# Patient Record
Sex: Female | Born: 1935 | Race: Asian | Hispanic: No | Smoking: Never smoker
Health system: Southern US, Community
[De-identification: ages and names within clinical notes are randomized; demographics above are authoritative.]

## PROBLEM LIST (undated history)

## (undated) DIAGNOSIS — E785 Hyperlipidemia, unspecified: Secondary | ICD-10-CM

## (undated) DIAGNOSIS — E119 Type 2 diabetes mellitus without complications: Secondary | ICD-10-CM

## (undated) DIAGNOSIS — I10 Essential (primary) hypertension: Secondary | ICD-10-CM

---

## 1998-01-04 ENCOUNTER — Encounter: Payer: Self-pay | Admitting: Ophthalmology

## 1998-01-04 ENCOUNTER — Ambulatory Visit (HOSPITAL_COMMUNITY): Admission: RE | Admit: 1998-01-04 | Discharge: 1998-01-05 | Payer: Self-pay | Admitting: Ophthalmology

## 2014-08-05 ENCOUNTER — Ambulatory Visit (INDEPENDENT_AMBULATORY_CARE_PROVIDER_SITE_OTHER): Payer: Medicare Other | Admitting: Emergency Medicine

## 2014-08-05 VITALS — BP 126/60 | HR 84 | Temp 98.8°F | Resp 17 | Ht <= 58 in | Wt 113.2 lb

## 2014-08-05 DIAGNOSIS — Z111 Encounter for screening for respiratory tuberculosis: Secondary | ICD-10-CM

## 2014-08-05 NOTE — Patient Instructions (Signed)

## 2014-08-05 NOTE — Progress Notes (Signed)
   Subjective:  Patient ID: Briana Shaffer, female    DOB: Jun 25, 1919  Age: 81 y.o. MRN: [id number]  CC: PPD Placement   HPI Briana Shaffer presents  with a need for tuberculosis. She's never had tuberculosis and the best of her daughter's knowledge. She has never tested positive. She denies any current complaints or medical issues not taking any medication  History Briana Shaffer has no past medical history on file.   She has no past surgical history on file.   Her  family history is not on file.  She   reports that she has never smoked. She does not have any smokeless tobacco history on file. Her alcohol and drug histories are not on file.  No outpatient prescriptions prior to visit.   No facility-administered medications prior to visit.    History   Social History  . Marital Status: Widowed    Spouse Name: N/A  . Number of Children: N/A  . Years of Education: N/A   Social History Main Topics  . Smoking status: Never Smoker   . Smokeless tobacco: Not on file  . Alcohol Use: Not on file  . Drug Use: Not on file  . Sexual Activity: Not on file   Other Topics Concern  . None   Social History Narrative  . None     Review of Systems  Constitutional: Negative for fever, chills and appetite change.  HENT: Negative for congestion, ear pain, postnasal drip, sinus pressure and sore throat.   Eyes: Negative for pain and redness.  Respiratory: Negative for cough, shortness of breath and wheezing.   Cardiovascular: Negative for leg swelling.  Gastrointestinal: Negative for nausea, vomiting, abdominal pain, diarrhea, constipation and blood in stool.  Endocrine: Negative for polyuria.  Genitourinary: Negative for dysuria, urgency, frequency and flank pain.  Musculoskeletal: Negative for gait problem.  Skin: Negative for rash.  Neurological: Negative for weakness and headaches.  Psychiatric/Behavioral: Negative for confusion and decreased concentration. The patient is not  nervous/anxious.     Objective:  BP 126/60 mmHg  Pulse 84  Temp(Src) 98.8 F (37.1 C) (Oral)  Resp 17  Ht 4' 9.5" (1.461 m)  Wt 113 lb 3.2 oz (51.347 kg)  BMI 24.06 kg/m2  SpO2 97%  Physical Exam  Constitutional: She is oriented to person, place, and time. She appears well-developed and well-nourished.  HENT:  Head: Normocephalic and atraumatic.  Eyes: Conjunctivae are normal. Pupils are equal, round, and reactive to light.  Pulmonary/Chest: Effort normal.  Musculoskeletal: She exhibits no edema.  Neurological: She is alert and oriented to person, place, and time.  Skin: Skin is dry.  Psychiatric: She has a normal mood and affect. Her behavior is normal. Thought content normal.      Assessment & Plan:   Briana Shaffer was seen today for ppd placement.  Diagnoses and all orders for this visit:  Screening-pulmonary TB Orders: -     TB Skin Test   [name] does not currently have medications on file.  No orders of the defined types were placed in this encounter.    Appropriate red flag conditions were discussed with the patient as well as actions that should be taken.  Patient expressed his understanding.  Follow-up: Return in about 2 days (around 08/07/2014).  Carmelina Dane, MD

## 2014-08-05 NOTE — Progress Notes (Signed)
  Tuberculosis Risk Questionnaire  1. Yes Were you born outside the [place] in one of the following parts of the world: [place], [place], [place], [place] or [place]?    2. No Have you traveled outside the [place] and lived for more than one month in one of the following parts of the world: [place], [place], [place], [place] or [place]?    3. No Do you have a compromised immune system such as from any of the following conditions:HIV/AIDS, organ or bone marrow transplantation, diabetes, immunosuppressive medicines (e.g. Prednisone, Remicaide), leukemia, lymphoma, cancer of the head or neck, gastrectomy or jejunal bypass, end-stage renal disease (on dialysis), or silicosis?     4. No Have you ever or do you plan on working in: a residential care center, a health care facility, a jail or prison or homeless shelter?    5. No Have you ever: injected illegal drugs, used crack cocaine, lived in a homeless shelter  or been in jail or prison?     6. No Have you ever been exposed to anyone with infectious tuberculosis?    Tuberculosis Symptom Questionnaire  Do you currently have any of the following symptoms?  1. No Unexplained cough lasting more than 3 weeks?   2. No Unexplained fever lasting more than 3 weeks.   3. No Night Sweats (sweating that leaves the bedclothes and sheets wet)     4. No Shortness of Breath   5. No Chest Pain   6. No Unintentional weight loss    7. No  Unexplained fatigue (very tired for no reason)   

## 2014-08-07 ENCOUNTER — Ambulatory Visit (INDEPENDENT_AMBULATORY_CARE_PROVIDER_SITE_OTHER): Payer: Medicare Other

## 2014-08-07 DIAGNOSIS — Z7689 Persons encountering health services in other specified circumstances: Secondary | ICD-10-CM

## 2014-08-07 DIAGNOSIS — Z111 Encounter for screening for respiratory tuberculosis: Secondary | ICD-10-CM

## 2014-08-07 LAB — TB SKIN TEST
Induration: 0 mm
TB Skin Test: NEGATIVE

## 2014-09-06 ENCOUNTER — Encounter: Payer: Self-pay | Admitting: Podiatry

## 2014-09-06 ENCOUNTER — Ambulatory Visit (INDEPENDENT_AMBULATORY_CARE_PROVIDER_SITE_OTHER): Payer: Medicare Other | Admitting: Podiatry

## 2014-09-06 VITALS — BP 138/82 | HR 76 | Ht <= 58 in | Wt 113.0 lb

## 2014-09-06 DIAGNOSIS — M79606 Pain in leg, unspecified: Secondary | ICD-10-CM

## 2014-09-06 DIAGNOSIS — B351 Tinea unguium: Secondary | ICD-10-CM

## 2014-09-06 NOTE — Patient Instructions (Signed)
Seen for painful nails. All nails debrided. Return as needed.

## 2014-09-06 NOTE — Progress Notes (Signed)
SUBJECTIVE: 81 y.o. year old female presents for deformed nails. Patient is ambulatory using a cane, accompanied by her daughter.  They are concerned about painful deformed nails.   REVIEW OF SYSTEMS: A comprehensive review of systems was negative.  OBJECTIVE: DERMATOLOGIC EXAMINATION: Nails: Thick yellow and dystrophic x 10.  VASCULAR EXAMINATION OF LOWER LIMBS: Pedal pulses: All pedal pulses are palpable with normal pulsation.  No pedal edema or erythema noted. No sign of ischemic changes.  NEUROLOGIC EXAMINATION OF THE LOWER LIMBS: All epicritic and tactile sensations grossly intact.  MUSCULOSKELETAL EXAMINATION: No gross deformities other than irregular bone surface right Fibular, which is an old fracture site that is healed.   ASSESSMENT: Onychomycosis x 10. Pain in lower limbs.  PLAN: All nails debrided. Return as needed.

## 2015-01-09 ENCOUNTER — Emergency Department: Payer: Medicare Other

## 2015-01-09 ENCOUNTER — Encounter: Payer: Self-pay | Admitting: *Deleted

## 2015-01-09 ENCOUNTER — Emergency Department
Admission: EM | Admit: 2015-01-09 | Discharge: 2015-01-09 | Disposition: A | Discharge status: Home / Self Care | Payer: Medicare Other | Attending: Emergency Medicine | Admitting: Emergency Medicine

## 2015-01-09 DIAGNOSIS — Z79899 Other long term (current) drug therapy: Secondary | ICD-10-CM | POA: Insufficient documentation

## 2015-01-09 DIAGNOSIS — Y998 Other external cause status: Secondary | ICD-10-CM | POA: Diagnosis not present

## 2015-01-09 DIAGNOSIS — Y9389 Activity, other specified: Secondary | ICD-10-CM | POA: Insufficient documentation

## 2015-01-09 DIAGNOSIS — F0390 Unspecified dementia without behavioral disturbance: Secondary | ICD-10-CM | POA: Insufficient documentation

## 2015-01-09 DIAGNOSIS — E119 Type 2 diabetes mellitus without complications: Secondary | ICD-10-CM | POA: Diagnosis not present

## 2015-01-09 DIAGNOSIS — W1839XA Other fall on same level, initial encounter: Secondary | ICD-10-CM | POA: Diagnosis not present

## 2015-01-09 DIAGNOSIS — I10 Essential (primary) hypertension: Secondary | ICD-10-CM | POA: Diagnosis not present

## 2015-01-09 DIAGNOSIS — Y92129 Unspecified place in nursing home as the place of occurrence of the external cause: Secondary | ICD-10-CM | POA: Insufficient documentation

## 2015-01-09 DIAGNOSIS — S42411A Displaced simple supracondylar fracture without intercondylar fracture of right humerus, initial encounter for closed fracture: Secondary | ICD-10-CM | POA: Diagnosis not present

## 2015-01-09 DIAGNOSIS — N3001 Acute cystitis with hematuria: Secondary | ICD-10-CM | POA: Diagnosis not present

## 2015-01-09 DIAGNOSIS — S4991XA Unspecified injury of right shoulder and upper arm, initial encounter: Secondary | ICD-10-CM | POA: Diagnosis present

## 2015-01-09 HISTORY — DX: Essential (primary) hypertension: I10

## 2015-01-09 HISTORY — DX: Type 2 diabetes mellitus without complications: E11.9

## 2015-01-09 HISTORY — DX: Hyperlipidemia, unspecified: E78.5

## 2015-01-09 LAB — CBC
HCT: 32.2 % — ABNORMAL LOW (ref 35.0–47.0)
HEMOGLOBIN: 11 g/dL — AB (ref 12.0–16.0)
MCH: 30.6 pg (ref 26.0–34.0)
MCHC: 34.1 g/dL (ref 32.0–36.0)
MCV: 89.8 fL (ref 80.0–100.0)
Platelets: 188 10*3/uL (ref 150–440)
RBC: 3.59 MIL/uL — AB (ref 3.80–5.20)
RDW: 14.2 % (ref 11.5–14.5)
WBC: 7.2 10*3/uL (ref 3.6–11.0)

## 2015-01-09 LAB — URINALYSIS COMPLETE WITH MICROSCOPIC (ARMC ONLY)
Bilirubin Urine: NEGATIVE
Glucose, UA: 150 mg/dL — AB
Ketones, ur: NEGATIVE mg/dL
Nitrite: NEGATIVE
PH: 5 (ref 5.0–8.0)
SPECIFIC GRAVITY, URINE: 1.01 (ref 1.005–1.030)

## 2015-01-09 LAB — COMPREHENSIVE METABOLIC PANEL
ALBUMIN: 3.4 g/dL — AB (ref 3.5–5.0)
ALK PHOS: 90 U/L (ref 38–126)
ALT: 7 U/L — AB (ref 14–54)
AST: 15 U/L (ref 15–41)
Anion gap: 5 (ref 5–15)
BUN: 24 mg/dL — ABNORMAL HIGH (ref 6–20)
CALCIUM: 8.9 mg/dL (ref 8.9–10.3)
CO2: 29 mmol/L (ref 22–32)
CREATININE: 1.19 mg/dL — AB (ref 0.44–1.00)
Chloride: 104 mmol/L (ref 101–111)
GFR calc Af Amer: 44 mL/min — ABNORMAL LOW (ref >60)
GFR calc non Af Amer: 38 mL/min — ABNORMAL LOW (ref >60)
GLUCOSE: 262 mg/dL — AB (ref 65–99)
Potassium: 4 mmol/L (ref 3.5–5.1)
SODIUM: 138 mmol/L (ref 135–145)
Total Bilirubin: 1 mg/dL (ref 0.3–1.2)
Total Protein: 6 g/dL — ABNORMAL LOW (ref 6.5–8.1)

## 2015-01-09 LAB — TROPONIN I: Troponin I: <0.03 ng/mL (ref <0.031)

## 2015-01-09 LAB — CK: Total CK: 51 U/L (ref 38–234)

## 2015-01-09 MED ORDER — DEXTROSE 5 % IV SOLN
1.0000 g | Freq: Once | INTRAVENOUS | Status: AC
  Administered 2015-01-09: 1 g via INTRAVENOUS
  Filled 2015-01-09: qty 10

## 2015-01-09 MED ORDER — CEPHALEXIN 500 MG PO CAPS: 500.0000 mg | ORAL_CAPSULE | Freq: Four times a day (QID) | ORAL | Status: AC

## 2015-01-09 NOTE — ED Notes (Signed)
Spoke with son of patient. Son responded and is in route to ER.

## 2015-01-09 NOTE — ED Notes (Signed)
[facility].

## 2015-01-09 NOTE — Discharge Instructions (Signed)
Urinary Tract Infection Urinary tract infections (UTIs) can develop anywhere along your urinary tract. Your urinary tract is your body's drainage system for removing wastes and extra water. Your urinary tract includes two kidneys, two ureters, a bladder, and a urethra. Your kidneys are a pair of bean-shaped organs. Each kidney is about the size of your fist. They are located below your ribs, one on each side of your spine. CAUSES Infections are caused by microbes, which are microscopic organisms, including fungi, viruses, and bacteria. These organisms are so small that they can only be seen through a microscope. Bacteria are the microbes that most commonly cause UTIs. SYMPTOMS  Symptoms of UTIs may vary by age and gender of the patient and by the location of the infection. Symptoms in young women typically include a frequent and intense urge to urinate and a painful, burning feeling in the bladder or urethra during urination. Older women and men are more likely to be tired, shaky, and weak and have muscle aches and abdominal pain. A fever may mean the infection is in your kidneys. Other symptoms of a kidney infection include pain in your back or sides below the ribs, nausea, and vomiting. DIAGNOSIS To diagnose a UTI, your caregiver will ask you about your symptoms. Your caregiver will also ask you to provide a urine sample. The urine sample will be tested for bacteria and white blood cells. White blood cells are made by your body to help fight infection. TREATMENT  Typically, UTIs can be treated with medication. Because most UTIs are caused by a bacterial infection, they usually can be treated with the use of antibiotics. The choice of antibiotic and length of treatment depend on your symptoms and the type of bacteria causing your infection. HOME CARE INSTRUCTIONS  If you were prescribed antibiotics, take them exactly as your caregiver instructs you. Finish the medication even if you feel better after  you have only taken some of the medication.  Drink enough water and fluids to keep your urine clear or pale yellow.  Avoid caffeine, tea, and carbonated beverages. They tend to irritate your bladder.  Empty your bladder often. Avoid holding urine for long periods of time.  Empty your bladder before and after sexual intercourse.  After a bowel movement, women should cleanse from front to back. Use each tissue only once. SEEK MEDICAL CARE IF:   You have back pain.  You develop a fever.  Your symptoms do not begin to resolve within 3 days. SEEK IMMEDIATE MEDICAL CARE IF:   You have severe back pain or lower abdominal pain.  You develop chills.  You have nausea or vomiting.  You have continued burning or discomfort with urination. MAKE SURE YOU:   Understand these instructions.  Will watch your condition.  Will get help right away if you are not doing well or get worse.   This information is not intended to replace advice given to you by your health care provider. Make sure you discuss any questions you have with your health care provider.   Document Released: 11/21/2004 Document Revised: 11/02/2014 Document Reviewed: 03/22/2011 Elsevier Interactive Patient Education 2016 Elsevier Inc.  Humerus Fracture Treated With Immobilization The humerus is the large bone in your upper arm. You have a broken (fractured) humerus. These fractures are easily diagnosed with X-rays. TREATMENT  Simple fractures which will heal without disability are treated with simple immobilization. Immobilization means you will wear a cast, splint, or sling. You have a fracture which will do well  with immobilization. The fracture will heal well simply by being held in a good position until it is stable enough to begin range of motion exercises. Do not take part in activities which would further injure your arm.  HOME CARE INSTRUCTIONS   Put ice on the injured area.  Put ice in a plastic bag.  Place a  towel between your skin and the bag.  Leave the ice on for 15-20 minutes, 03-04 times a day.  If you have a cast:  Do not scratch the skin under the cast using sharp or pointed objects.  Check the skin around the cast every day. You may put lotion on any red or sore areas.  Keep your cast dry and clean.  If you have a splint:  Wear the splint as directed.  Keep your splint dry and clean.  You may loosen the elastic around the splint if your fingers become numb, tingle, or turn cold or blue.  If you have a sling:  Wear the sling as directed.  Do not put pressure on any part of your cast or splint until it is fully hardened.  Your cast or splint can be protected during bathing with a plastic bag. Do not lower the cast or splint into water.  Only take over-the-counter or prescription medicines for pain, discomfort, or fever as directed by your caregiver.  Do range of motion exercises as instructed by your caregiver.  Follow up as directed by your caregiver. This is very important in order to avoid permanent injury or disability and chronic pain. SEEK IMMEDIATE MEDICAL CARE IF:   Your skin or nails in the injured arm turn blue or gray.  Your arm feels cold or numb.  You develop severe pain in the injured arm.  You are having problems with the medicines you were given. MAKE SURE YOU:   Understand these instructions.  Will watch your condition.  Will get help right away if you are not doing well or get worse.   This information is not intended to replace advice given to you by your health care provider. Make sure you discuss any questions you have with your health care provider.   Document Released: 05/20/2000 Document Revised: 03/04/2014 Document Reviewed: 07/06/2014 Elsevier Interactive Patient Education [employer]2016 Elsevier Inc.

## 2015-01-09 NOTE — ED Notes (Signed)
Language [employer] Interpreter services attempted. Pt unable to respond to verbal and painful stimuli commands. MD verbal discontinuation of interpreter services.

## 2015-01-09 NOTE — ED Notes (Signed)
Patient transported to X-ray 

## 2015-01-09 NOTE — ED Notes (Signed)
Pt from [facility], was found on floor unknown down time, c/o rt elbow and shoulder pain.  Rt elbow swollen.  Pt speaks no english. Grimace upon palpation to rt shoulder and elbow.

## 2015-01-09 NOTE — ED Notes (Signed)
Family member has failed to arrive to ER, attempted to call a 2nd time. Son answered and responded for an ETA arrival time being 9 AM. Will make MD aware.

## 2015-01-09 NOTE — ED Provider Notes (Signed)
[facility] Emergency Department Provider Note  ____________________________________________  Time seen: Approximately 233 AM  I have reviewed the triage vital signs and the nursing notes.   HISTORY  Chief Complaint Shoulder Injury and Fall  History limited by patient language barrier and patient dementia  HPI Briana Shaffer is a 81 y.o. female who comes in from her nursing home with an unwitnessed fall. We attempted to use the language line to obtain a history but the patient would not verbally answer questions. She nodded and shook her head inappropriately to questions. I am unsure what occurred to cause the patient to fall. Per EMS the patient had some hypotension to 74/50 but when she arrived her BP was 153/97.   Past Medical History  Diagnosis Date  . Diabetes mellitus without complication (HCC)   . Hypertension   . Hyperlipemia        Dementia  Patient Active Problem List   Diagnosis Date Noted  . Onychomycosis 09/06/2014  . Pain in lower limb 09/06/2014    History reviewed. No pertinent past surgical history.  Current Outpatient Rx  Name  Route  Sig  Dispense  Refill  . acetaminophen (TYLENOL) 325 MG tablet   Oral   Take 650 mg by mouth every 6 (six) hours as needed.         . lamoTRIgine (LAMICTAL) 25 MG tablet   Oral   Take 25 mg by mouth 2 (two) times daily.         . QUEtiapine (SEROQUEL) 50 MG tablet   Oral   Take 50 mg by mouth 3 (three) times daily.         . traMADol (ULTRAM) 50 MG tablet   Oral   Take 50 mg by mouth every 12 (twelve) hours as needed.         . cephALEXin (KEFLEX) 500 MG capsule   Oral   Take 1 capsule (500 mg total) by mouth 4 (four) times daily.   40 capsule   0     Allergies Review of patient's allergies indicates no known allergies.  History reviewed. No pertinent family history.  Social History Social History  Substance Use Topics  . Smoking status: Never Smoker   . Smokeless  tobacco: Never Used  . Alcohol Use: No    Review of Systems Unable to assess due to patient dementia and language barrier  ____________________________________________   PHYSICAL EXAM:  VITAL SIGNS: ED Triage Vitals  Enc Vitals Group     BP 01/09/15 0209 153/97 mmHg     Pulse Rate 01/09/15 0209 101     Resp 01/09/15 0505 20     Temp 01/09/15 0209 98.2 F (36.8 C)     Temp Source 01/09/15 0209 Oral     SpO2 01/09/15 0209 98 %     Weight 01/09/15 0209 125 lb (56.7 kg)     Height 01/09/15 0209 4\' 10"  (1.473 m)     Head Cir --      Peak Flow --      Pain Score --      Pain Loc --      Pain Edu? --      Excl. in GC? --     Constitutional: patient with eyes closed, opens eyes to stimuli but not making verbal response Eyes: Conjunctivae are normal. PERRL. EOMI. Head: Atraumatic. Nose: No congestion/rhinnorhea. Mouth/Throat: Mucous membranes are moist.  Oropharynx non-erythematous. Cardiovascular: Normal rate, regular rhythm. Grossly normal heart sounds.  Good peripheral circulation. Respiratory: Normal respiratory effort.  No retractions. Lungs CTAB. Gastrointestinal: Soft and nontender. No distention. Positive bowel sounds Musculoskeletal: grimaces with manipulation of right arm  Neurologic:  Not responding verbally Skin:  Skin is warm, dry and intact. No rash noted. Psychiatric: Mood and affect are normal.   ____________________________________________   LABS (all labs ordered are listed, but only abnormal results are displayed)  Labs Reviewed  CBC - Abnormal; Notable for the following:    RBC 3.59 (*)    Hemoglobin 11.0 (*)    HCT 32.2 (*)    All other components within normal limits  COMPREHENSIVE METABOLIC PANEL - Abnormal; Notable for the following:    Glucose, Bld 262 (*)    BUN 24 (*)    Creatinine, Ser 1.19 (*)    Total Protein 6.0 (*)    Albumin 3.4 (*)    ALT 7 (*)    GFR calc non Af Amer 38 (*)    GFR calc Af Amer 44 (*)    All other components  within normal limits  URINALYSIS COMPLETEWITH MICROSCOPIC ([facility] ONLY) - Abnormal; Notable for the following:    Color, Urine YELLOW (*)    APPearance CLOUDY (*)    Glucose, UA 150 (*)    Hgb urine dipstick 2+ (*)    Protein, ur >500 (*)    Leukocytes, UA TRACE (*)    Bacteria, UA MANY (*)    Squamous Epithelial / LPF 0-5 (*)    All other components within normal limits  URINE CULTURE  CULTURE, BLOOD (ROUTINE X 2)  CULTURE, BLOOD (ROUTINE X 2)  TROPONIN I  CK   ____________________________________________  EKG  ED ECG REPORT I, Rebecka Apley, the attending physician, personally viewed and interpreted this ECG.   Date: 01/09/2015  EKG Time: 212  Rate: 108  Rhythm: sinus tachycardia  Axis: normal  Intervals:none  ST&T Change: none  ____________________________________________  RADIOLOGY  CT head and cervical spine: No acute intracranial process, moderate to severe chronic small vessel ischemic disease, Straightened cervical lordosis without acute fracture, minimal grade 1 C4-5 and C6-7 anterolithesis on degenerative basis.   DG shoulder: NO evidence of right shoulder fracture  DG Elbow: displaced transverse supracondylar fracture with mild angulation  ____________________________________________   PROCEDURES  Procedure(s) performed: None  Critical Care performed: No  ____________________________________________   INITIAL IMPRESSION / ASSESSMENT AND PLAN / ED COURSE  Pertinent labs & imaging results that were available during my care of the patient were reviewed by me and considered in my medical decision making (see chart for details).  Is a 81 year old female who comes in today with a fall at her nursing home and a supracondylar fracture. I did contact Dr. Rosita Kea who suggested splinting the patient and having her follow-up in one week. Given the patient's age and dementia as well as the fact that no family is here he said it's going to be a difficult  repair so he will evaluate her in 1 week. The patient does also have a urinary tract infection. I did order some ceftriaxone to treat her UTI. I will assess if the patient is able to ambulate and I will also assess the CK. I will reassess the patient once I received those results.  The patient is wheel chair bound. She will be discharged home with antibiotics and follow up with PCP ____________________________________________   FINAL CLINICAL IMPRESSION(S) / ED DIAGNOSES  Final diagnoses:  Supracondylar fracture of humerus, right, closed, initial  encounter  Acute cystitis with hematuria      Rebecka Apley, MD 01/09/15 [phone]

## 2015-01-09 NOTE — ED Notes (Signed)
[facility].

## 2015-01-11 LAB — URINE CULTURE: Culture: >=100000

## 2015-01-14 LAB — CULTURE, BLOOD (ROUTINE X 2)
CULTURE: NO GROWTH
Culture: NO GROWTH

## 2015-01-22 ENCOUNTER — Emergency Department
Admission: EM | Admit: 2015-01-22 | Discharge: 2015-01-22 | Disposition: A | Discharge status: Home / Self Care | Payer: Medicare Other | Attending: Emergency Medicine | Admitting: Emergency Medicine

## 2015-01-22 ENCOUNTER — Encounter: Payer: Self-pay | Admitting: Emergency Medicine

## 2015-01-22 ENCOUNTER — Emergency Department: Payer: Medicare Other

## 2015-01-22 DIAGNOSIS — F0390 Unspecified dementia without behavioral disturbance: Secondary | ICD-10-CM | POA: Diagnosis not present

## 2015-01-22 DIAGNOSIS — T148XXA Other injury of unspecified body region, initial encounter: Secondary | ICD-10-CM

## 2015-01-22 DIAGNOSIS — Y9289 Other specified places as the place of occurrence of the external cause: Secondary | ICD-10-CM | POA: Diagnosis not present

## 2015-01-22 DIAGNOSIS — Z79899 Other long term (current) drug therapy: Secondary | ICD-10-CM | POA: Insufficient documentation

## 2015-01-22 DIAGNOSIS — Y9389 Activity, other specified: Secondary | ICD-10-CM | POA: Insufficient documentation

## 2015-01-22 DIAGNOSIS — S0990XA Unspecified injury of head, initial encounter: Secondary | ICD-10-CM | POA: Diagnosis present

## 2015-01-22 DIAGNOSIS — Y998 Other external cause status: Secondary | ICD-10-CM | POA: Insufficient documentation

## 2015-01-22 DIAGNOSIS — W1839XA Other fall on same level, initial encounter: Secondary | ICD-10-CM | POA: Diagnosis not present

## 2015-01-22 DIAGNOSIS — I10 Essential (primary) hypertension: Secondary | ICD-10-CM | POA: Insufficient documentation

## 2015-01-22 DIAGNOSIS — E119 Type 2 diabetes mellitus without complications: Secondary | ICD-10-CM | POA: Diagnosis not present

## 2015-01-22 DIAGNOSIS — W19XXXA Unspecified fall, initial encounter: Secondary | ICD-10-CM

## 2015-01-22 DIAGNOSIS — S0083XA Contusion of other part of head, initial encounter: Secondary | ICD-10-CM | POA: Diagnosis not present

## 2015-01-22 LAB — CBC WITH DIFFERENTIAL/PLATELET
BASOS ABS: 0 10*3/uL (ref 0–0.1)
BASOS PCT: 1 %
EOS ABS: 0.1 10*3/uL (ref 0–0.7)
EOS PCT: 2 %
HCT: 31.8 % — ABNORMAL LOW (ref 35.0–47.0)
Hemoglobin: 10.4 g/dL — ABNORMAL LOW (ref 12.0–16.0)
Lymphocytes Relative: 35 %
Lymphs Abs: 2.2 10*3/uL (ref 1.0–3.6)
MCH: 29.4 pg (ref 26.0–34.0)
MCHC: 32.7 g/dL (ref 32.0–36.0)
MCV: 89.9 fL (ref 80.0–100.0)
Monocytes Absolute: 0.6 10*3/uL (ref 0.2–0.9)
Monocytes Relative: 9 %
NEUTROS PCT: 53 %
Neutro Abs: 3.4 10*3/uL (ref 1.4–6.5)
PLATELETS: 259 10*3/uL (ref 150–440)
RBC: 3.53 MIL/uL — AB (ref 3.80–5.20)
RDW: 14.1 % (ref 11.5–14.5)
WBC: 6.3 10*3/uL (ref 3.6–11.0)

## 2015-01-22 LAB — BASIC METABOLIC PANEL
Anion gap: 6 (ref 5–15)
BUN: 23 mg/dL — AB (ref 6–20)
CALCIUM: 9 mg/dL (ref 8.9–10.3)
CHLORIDE: 100 mmol/L — AB (ref 101–111)
CO2: 32 mmol/L (ref 22–32)
CREATININE: 1.24 mg/dL — AB (ref 0.44–1.00)
GFR calc Af Amer: 42 mL/min — ABNORMAL LOW (ref >60)
GFR calc non Af Amer: 36 mL/min — ABNORMAL LOW (ref >60)
GLUCOSE: 207 mg/dL — AB (ref 65–99)
Potassium: 4.1 mmol/L (ref 3.5–5.1)
SODIUM: 138 mmol/L (ref 135–145)

## 2015-01-22 LAB — URINALYSIS COMPLETE WITH MICROSCOPIC (ARMC ONLY)
BACTERIA UA: NONE SEEN
Bilirubin Urine: NEGATIVE
Glucose, UA: 50 mg/dL — AB
Ketones, ur: NEGATIVE mg/dL
Leukocytes, UA: NEGATIVE
Nitrite: NEGATIVE
PH: 6 (ref 5.0–8.0)
Specific Gravity, Urine: 1.012 (ref 1.005–1.030)

## 2015-01-22 LAB — TROPONIN I: Troponin I: <0.03 ng/mL (ref <0.031)

## 2015-01-22 MED ORDER — ONDANSETRON HCL 4 MG/2ML IJ SOLN
4.0000 mg | Freq: Once | INTRAMUSCULAR | Status: AC
  Administered 2015-01-22: 4 mg via INTRAVENOUS

## 2015-01-22 MED ORDER — IOHEXOL 300 MG/ML  SOLN
80.0000 mL | Freq: Once | INTRAMUSCULAR | Status: AC | PRN
  Administered 2015-01-22: 75 mL via INTRAVENOUS

## 2015-01-22 MED ORDER — MORPHINE SULFATE (PF) 2 MG/ML IV SOLN
2.0000 mg | Freq: Once | INTRAVENOUS | Status: AC
  Administered 2015-01-22: 2 mg via INTRAVENOUS
  Filled 2015-01-22: qty 1

## 2015-01-22 MED ORDER — ONDANSETRON HCL 4 MG/2ML IJ SOLN
INTRAMUSCULAR | Status: AC
  Administered 2015-01-22: 4 mg via INTRAVENOUS
  Filled 2015-01-22: qty 2

## 2015-01-22 NOTE — ED Notes (Signed)
Pt from [facility] c/o unwitnessed fall

## 2015-01-22 NOTE — Discharge Instructions (Signed)
Please seek medical attention for any high fevers, chest pain, shortness of breath, change in behavior, persistent vomiting, bloody stool or any other new or concerning symptoms. ° °Fall Prevention in the Home  °Falls can cause injuries and can affect people from all age groups. There are many simple things that you can do to make your home safe and to help prevent falls. °WHAT CAN I DO ON THE OUTSIDE OF MY HOME? °· Regularly repair the edges of walkways and driveways and fix any cracks. °· Remove high doorway thresholds. °· Trim any shrubbery on the main path into your home. °· Use bright outdoor lighting. °· Clear walkways of debris and clutter, including tools and rocks. °· Regularly check that handrails are securely fastened and in good repair. Both sides of any steps should have handrails. °· Install guardrails along the edges of any raised decks or porches. °· Have leaves, snow, and ice cleared regularly. °· Use sand or salt on walkways during winter months. °· In the garage, clean up any spills right away, including grease or oil spills. °WHAT CAN I DO IN THE BATHROOM? °· Use night lights. °· Install grab bars by the toilet and in the tub and shower. Do not use towel bars as grab bars. °· Use non-skid mats or decals on the floor of the tub or shower. °· If you need to sit down while you are in the shower, use a plastic, non-slip stool.. °· Keep the floor dry. Immediately clean up any water that spills on the floor. °· Remove soap buildup in the tub or shower on a regular basis. °· Attach bath mats securely with double-sided non-slip rug tape. °· Remove throw rugs and other tripping hazards from the floor. °WHAT CAN I DO IN THE BEDROOM? °· Use night lights. °· Make sure that a bedside light is easy to reach. °· Do not use oversized bedding that drapes onto the floor. °· Have a firm chair that has side arms to use for getting dressed. °· Remove throw rugs and other tripping hazards from the floor. °WHAT CAN I  DO IN THE KITCHEN?  °· Clean up any spills right away. °· Avoid walking on wet floors. °· Place frequently used items in easy-to-reach places. °· If you need to reach for something above you, use a sturdy step stool that has a grab bar. °· Keep electrical cables out of the way. °· Do not use floor polish or wax that makes floors slippery. If you have to use wax, make sure that it is non-skid floor wax. °· Remove throw rugs and other tripping hazards from the floor. °WHAT CAN I DO IN THE STAIRWAYS? °· Do not leave any items on the stairs. °· Make sure that there are handrails on both sides of the stairs. Fix handrails that are broken or loose. Make sure that handrails are as long as the stairways. °· Check any carpeting to make sure that it is firmly attached to the stairs. Fix any carpet that is loose or worn. °· Avoid having throw rugs at the top or bottom of stairways, or secure the rugs with carpet tape to prevent them from moving. °· Make sure that you have a light switch at the top of the stairs and the bottom of the stairs. If you do not have them, have them installed. °WHAT ARE SOME OTHER FALL PREVENTION TIPS? °· Wear closed-toe shoes that fit well and support your feet. Wear shoes that have rubber soles or low   heels. °· When you use a stepladder, make sure that it is completely opened and that the sides are firmly locked. Have someone hold the ladder while you are using it. Do not climb a closed stepladder. °· Add color or contrast paint or tape to grab bars and handrails in your home. Place contrasting color strips on the first and last steps. °· Use mobility aids as needed, such as canes, walkers, scooters, and crutches. °· Turn on lights if it is dark. Replace any light bulbs that burn out. °· Set up furniture so that there are clear paths. Keep the furniture in the same spot. °· Fix any uneven floor surfaces. °· Choose a carpet design that does not hide the edge of steps of a stairway. °· Be aware of any  and all pets. °· Review your medicines with your healthcare provider. Some medicines can cause dizziness or changes in blood pressure, which increase your risk of falling. °Talk with your health care provider about other ways that you can decrease your risk of falls. This may include working with a physical therapist or trainer to improve your strength, balance, and endurance. °  °This information is not intended to replace advice given to you by your health care provider. Make sure you discuss any questions you have with your health care provider. °  °Document Released: 02/01/2002 Document Revised: 06/28/2014 Document Reviewed: 03/18/2014 °Elsevier Interactive Patient Education ©2016 Elsevier Inc. ° °

## 2015-01-22 NOTE — ED Notes (Signed)
Pt to ct 

## 2015-01-22 NOTE — ED Notes (Signed)
Patient transported to CT 

## 2015-01-22 NOTE — ED Provider Notes (Signed)
CT was done because of the hematuria. This could possibly be due to the need to catheterize the patient get a urine specimen. CT showing Harrold Donathobert Holmes there were no white blood cells in the urine at all. Cannot find any other acute pathology I will discharge the patient as we discussed with Dr. Sherlyn LickGoodman  Jolene Guyett F Sunnie Odden, MD 01/22/15 [phone]

## 2015-01-22 NOTE — ED Notes (Signed)
Pt unable to comply with instructions, even using interpreter. Pt moaning and will not open her eyes. She was able to respond affirmatively when asked if she was in pain, as well as whether or not her pain was in her head.

## 2015-01-22 NOTE — ED Provider Notes (Signed)
[facility] Emergency Department Provider Note    ____________________________________________  Time seen: 1345  I have reviewed the triage vital signs and the nursing notes.   HISTORY  Chief Complaint Fall   History limited by: dementia, language barrier   HPI Briana Shaffer is a 81 y.o. female who speaks screen however is essentially nonverbal who presents to the emergency department from living facility today after a fall.the patient is unable to give any history. The patient does suffer a hematoma to her head. Patient was seen recently in the ED and diagnosed with a supracondylar fracture of the right arm. She did have placed this splint. The patient has had some swelling in the hand although has been evaluated for that.   Past Medical History  Diagnosis Date  . Diabetes mellitus without complication (HCC)   . Hypertension   . Hyperlipemia     Patient Active Problem List   Diagnosis Date Noted  . Onychomycosis 09/06/2014  . Pain in lower limb 09/06/2014    History reviewed. No pertinent past surgical history.  Current Outpatient Rx  Name  Route  Sig  Dispense  Refill  . acetaminophen (TYLENOL) 325 MG tablet   Oral   Take 650 mg by mouth every 6 (six) hours as needed.         . lamoTRIgine (LAMICTAL) 25 MG tablet   Oral   Take 25 mg by mouth 2 (two) times daily.         . QUEtiapine (SEROQUEL) 50 MG tablet   Oral   Take 50 mg by mouth 3 (three) times daily.         . traMADol (ULTRAM) 50 MG tablet   Oral   Take 50 mg by mouth every 12 (twelve) hours as needed.           Allergies Review of patient's allergies indicates no known allergies.  History reviewed. No pertinent family history.  Social History Social History  Substance Use Topics  . Smoking status: Never Smoker   . Smokeless tobacco: Never Used  . Alcohol Use: No    Review of Systems Unable to obtain secondary to language barrier and  dementia  ____________________________________________   PHYSICAL EXAM:  VITAL SIGNS: ED Triage Vitals  Enc Vitals Group     BP 01/22/15 1339 165/96 mmHg     Pulse Rate 01/22/15 1339 87     Resp --      Temp 01/22/15 1339 97.6 F (36.4 C)     Temp Source 01/22/15 1339 Oral     SpO2 01/22/15 1339 100 %     Weight 01/22/15 1339 121 lb (54.885 kg)     Height 01/22/15 1339 5' (1.524 m)   Constitutional: Alert and oriented. Well appearing and in no distress. Eyes: Conjunctivae are normal. PERRL. Normal extraocular movements. ENT   Head: Normocephalic, small hematoma to posterior scalp   Nose: No congestion/rhinnorhea.   Mouth/Throat: Mucous membranes are moist.   Neck: No stridor. Hematological/Lymphatic/Immunilogical: No cervical lymphadenopathy. Cardiovascular: Normal rate, regular rhythm.  No murmurs, rubs, or gallops. Respiratory: Normal respiratory effort without tachypnea nor retractions. Breath sounds are clear and equal bilaterally. No wheezes/rales/rhonchi. Gastrointestinal: Soft and nontender. No distention.  Genitourinary: Deferred Musculoskeletal: Right arm in splint, some edema to hand, however soft, non tender, RP 2+. No proximal swelling in the arm. Neurologic:  Awake. Demented. Non verbal. Appears to move all extremities.  Skin:  Skin is warm, dry and intact. No rash noted.  ____________________________________________    LABS (pertinent positives/negatives)  WBC 6.3 Hgb 10.4 Plt 259 Na 128 K 4.1 Trop <0.03 ____________________________________________   EKG  I, Phineas Semen, attending physician, personally viewed and interpreted this EKG  EKG Time: 1402 Rate: 89 Rhythm: normal sinus rhythm Axis: normal Intervals: qtc 448 QRS: narrow, q waves V1, V2 ST changes: no st elevation Impression: abnormal ekg ____________________________________________    RADIOLology  CT head/cervical spine  IMPRESSION: No acute intracranial  findings.  Chronic ischemic microvascular disease with a few old bilateral basal ganglia lacunar infarcts and evidence of age related atrophy. Stable air-fluid level over the sphenoid sinus.  No acute cervical spine injury.  Mild spondylosis of the cervical spine with disc disease at the C5-6 level. Moderate bilateral neural foraminal narrowing at multiple levels.   ____________________________________________   PROCEDURES  Procedure(s) performed: None  Critical Care performed: No  ____________________________________________   INITIAL IMPRESSION / ASSESSMENT AND PLAN / ED COURSE  Pertinent labs & imaging results that were available during my care of the patient were reviewed by me and considered in my medical decision making (see chart for details).  Patient presented from living facility after an unwitnessed fall. Patient unfortunately unable to give any history. Patient does have a history of falls. CT head and cervical spine without any concerning findings. Blood work without any concerning findings. Awaiting a UA at time of sign out. ____________________________________________   FINAL CLINICAL IMPRESSION(S) / ED DIAGNOSES  Briana Boxer, MD 01/23/15 1450

## 2015-01-28 ENCOUNTER — Emergency Department: Payer: Medicare Other

## 2015-01-28 ENCOUNTER — Encounter: Payer: Self-pay | Admitting: Emergency Medicine

## 2015-01-28 ENCOUNTER — Emergency Department
Admission: EM | Admit: 2015-01-28 | Discharge: 2015-01-29 | Disposition: A | Discharge status: Home / Self Care | Payer: Medicare Other | Attending: Emergency Medicine | Admitting: Emergency Medicine

## 2015-01-28 DIAGNOSIS — Z79899 Other long term (current) drug therapy: Secondary | ICD-10-CM | POA: Diagnosis not present

## 2015-01-28 DIAGNOSIS — E119 Type 2 diabetes mellitus without complications: Secondary | ICD-10-CM | POA: Diagnosis not present

## 2015-01-28 DIAGNOSIS — N390 Urinary tract infection, site not specified: Secondary | ICD-10-CM | POA: Diagnosis not present

## 2015-01-28 DIAGNOSIS — S0083XA Contusion of other part of head, initial encounter: Secondary | ICD-10-CM | POA: Diagnosis not present

## 2015-01-28 DIAGNOSIS — Y998 Other external cause status: Secondary | ICD-10-CM | POA: Diagnosis not present

## 2015-01-28 DIAGNOSIS — IMO0002 Reserved for concepts with insufficient information to code with codable children: Secondary | ICD-10-CM

## 2015-01-28 DIAGNOSIS — R4182 Altered mental status, unspecified: Secondary | ICD-10-CM | POA: Insufficient documentation

## 2015-01-28 DIAGNOSIS — Y9389 Activity, other specified: Secondary | ICD-10-CM | POA: Diagnosis not present

## 2015-01-28 DIAGNOSIS — Y9289 Other specified places as the place of occurrence of the external cause: Secondary | ICD-10-CM | POA: Insufficient documentation

## 2015-01-28 DIAGNOSIS — S0081XA Abrasion of other part of head, initial encounter: Secondary | ICD-10-CM | POA: Diagnosis not present

## 2015-01-28 DIAGNOSIS — S0990XA Unspecified injury of head, initial encounter: Secondary | ICD-10-CM | POA: Diagnosis present

## 2015-01-28 DIAGNOSIS — I10 Essential (primary) hypertension: Secondary | ICD-10-CM | POA: Diagnosis not present

## 2015-01-28 DIAGNOSIS — W19XXXA Unspecified fall, initial encounter: Secondary | ICD-10-CM

## 2015-01-28 LAB — CBC WITH DIFFERENTIAL/PLATELET
BASOS PCT: 0 %
Basophils Absolute: 0 10*3/uL (ref 0–0.1)
Eosinophils Absolute: 0.1 10*3/uL (ref 0–0.7)
Eosinophils Relative: 1 %
HEMATOCRIT: 29.4 % — AB (ref 35.0–47.0)
HEMOGLOBIN: 9.6 g/dL — AB (ref 12.0–16.0)
LYMPHS ABS: 1.7 10*3/uL (ref 1.0–3.6)
Lymphocytes Relative: 25 %
MCH: 29.5 pg (ref 26.0–34.0)
MCHC: 32.8 g/dL (ref 32.0–36.0)
MCV: 89.9 fL (ref 80.0–100.0)
MONOS PCT: 8 %
Monocytes Absolute: 0.6 10*3/uL (ref 0.2–0.9)
NEUTROS ABS: 4.5 10*3/uL (ref 1.4–6.5)
NEUTROS PCT: 66 %
Platelets: 245 10*3/uL (ref 150–440)
RBC: 3.27 MIL/uL — ABNORMAL LOW (ref 3.80–5.20)
RDW: 14 % (ref 11.5–14.5)
WBC: 6.9 10*3/uL (ref 3.6–11.0)

## 2015-01-28 LAB — BASIC METABOLIC PANEL
Anion gap: 7 (ref 5–15)
BUN: 27 mg/dL — ABNORMAL HIGH (ref 6–20)
CHLORIDE: 100 mmol/L — AB (ref 101–111)
CO2: 29 mmol/L (ref 22–32)
CREATININE: 1.23 mg/dL — AB (ref 0.44–1.00)
Calcium: 8.7 mg/dL — ABNORMAL LOW (ref 8.9–10.3)
GFR calc non Af Amer: 37 mL/min — ABNORMAL LOW (ref >60)
GFR, EST AFRICAN AMERICAN: 42 mL/min — AB (ref >60)
Glucose, Bld: 236 mg/dL — ABNORMAL HIGH (ref 65–99)
POTASSIUM: 3.6 mmol/L (ref 3.5–5.1)
Sodium: 136 mmol/L (ref 135–145)

## 2015-01-28 LAB — TYPE AND SCREEN
ABO/RH(D): A POS
ANTIBODY SCREEN: NEGATIVE

## 2015-01-28 LAB — PROTIME-INR
INR: 1.06
Prothrombin Time: 14 seconds (ref 11.4–15.0)

## 2015-01-28 LAB — ABO/RH: ABO/RH(D): A POS

## 2015-01-28 LAB — APTT: APTT: 31 s (ref 24–36)

## 2015-01-28 LAB — TROPONIN I

## 2015-01-28 NOTE — ED Provider Notes (Signed)
[facility] Emergency Department Provider Note  ____________________________________________  Time seen: Seen upon arrival to the emergency department  I have reviewed the triage vital signs and the nursing notes.   HISTORY  Chief Complaint Fall and Altered Mental Status  Patient is mostly [place] speaking and also has a history of dementia and per the EMS usually responds to her name and says her name only.  HPI Briana Shaffer is a 81 y.o. female with a history of diabetes and dementia who is presenting tonight after a fall with decreased mental status. Per EMS she was seen earlier this afternoon falling out of her wheelchair forward onto her face at about 2 PM. She developed hematoma and also decreased mentation since.The rest of the history is confounded by the patient's altered mental status.   Past Medical History  Diagnosis Date  . Diabetes mellitus without complication (HCC)   . Hypertension   . Hyperlipemia     Patient Active Problem List   Diagnosis Date Noted  . Onychomycosis 09/06/2014  . Pain in lower limb 09/06/2014    History reviewed. No pertinent past surgical history.  Current Outpatient Rx  Name  Route  Sig  Dispense  Refill  . acetaminophen (TYLENOL) 325 MG tablet   Oral   Take 650 mg by mouth every 6 (six) hours as needed.         . lamoTRIgine (LAMICTAL) 25 MG tablet   Oral   Take 25 mg by mouth 2 (two) times daily.         . QUEtiapine (SEROQUEL) 50 MG tablet   Oral   Take 50 mg by mouth 3 (three) times daily.         . traMADol (ULTRAM) 50 MG tablet   Oral   Take 50 mg by mouth every 12 (twelve) hours as needed.           Allergies Review of patient's allergies indicates no known allergies.  History reviewed. No pertinent family history.  Social History Social History  Substance Use Topics  . Smoking status: Never Smoker   . Smokeless tobacco: Never Used  . Alcohol Use: No    Review of  Systems Caveat secondary to the patient's altered mental status and also language barrier.  ____________________________________________   PHYSICAL EXAM:  VITAL SIGNS: ED Triage Vitals  Enc Vitals Group     BP 01/28/15 2113 137/73 mmHg     Pulse Rate 01/28/15 2113 91     Resp 01/28/15 2113 14     Temp 01/28/15 2113 98.4 F (36.9 C)     Temp Source 01/28/15 2113 Oral     SpO2 01/28/15 2113 95 %     Weight 01/28/15 2113 120 lb (54.432 kg)     Height 01/28/15 2113 5' (1.524 m)     Head Cir --      Peak Flow --      Pain Score --      Pain Loc --      Pain Edu? --      Excl. in GC? --     Constitutional: Patient alert and oriented to her name only. Eyes are closed and she is not following simple commands. Unclear if this is related to mentation or is due to the language barrier. Eyes: Conjunctivae are normal. Right eye oval-shaped but history of a cataract in that eye. The left eye is 2-3 mm and minimally reactive. EOMI. Head: Frontal hematoma with abrasion that is  1-2 cm. The abrasion is superficial.  Nose: No congestion/rhinnorhea. Mouth/Throat: Mucous membranes are moist.   Neck: No stridor.   Cardiovascular: Normal rate, regular rhythm. Grossly normal heart sounds.  Good peripheral circulation. Respiratory: Normal respiratory effort.  No retractions. Lungs CTAB. Gastrointestinal: Soft and nontender. No distention. No abdominal bruits. No CVA tenderness. Musculoskeletal: No lower extremity tenderness nor edema.  No joint effusions. Right upper extremity is in a sling secondary to previous fracture. Neurologic:  Moving the bilateral lower extremities equally. Not following commands to move the left upper extremity or right upper extremity. Does have good tone to the left upper extremity. Right upper extremity is limited to the exam because of the sling. She does have bilateral and intact radial pulses. Skin:  Skin is warm, dry and intact. No rash noted. Psychiatric: Mood and  affect are normal. Speech and behavior are normal.  ____________________________________________   LABS (all labs ordered are listed, but only abnormal results are displayed)  Labs Reviewed  CBC WITH DIFFERENTIAL/PLATELET - Abnormal; Notable for the following:    RBC 3.27 (*)    Hemoglobin 9.6 (*)    HCT 29.4 (*)    All other components within normal limits  BASIC METABOLIC PANEL - Abnormal; Notable for the following:    Chloride 100 (*)    Glucose, Bld 236 (*)    BUN 27 (*)    Creatinine, Ser 1.23 (*)    Calcium 8.7 (*)    GFR calc non Af Amer 37 (*)    GFR calc Af Amer 42 (*)    All other components within normal limits  PROTIME-INR  APTT  TROPONIN I  URINALYSIS COMPLETEWITH MICROSCOPIC ([facility] ONLY)  TYPE AND SCREEN  ABO/RH   ____________________________________________  EKG  ED ECG REPORT I, Arelia LongestSchaevitz,  Ranferi Clingan M, the attending physician, personally viewed and interpreted this ECG.   Date: 01/28/2015  EKG Time: 2121  Rate: 95  Rhythm: normal EKG, normal sinus rhythm  Axis: Normal axis  Intervals:none  ST&T Change: No ST segment elevation or depression. No abnormal T-wave inversion.  ____________________________________________  RADIOLOGY  Chest x-ray with small amount of atelectasis or scarring of the left lung base. Pelvis without any acute fracture dislocation. CT head without any acute intracranial abnormality. No acute findings on the CT maxillofacial or cervical spine. ____________________________________________   PROCEDURES  ____________________________________________   INITIAL IMPRESSION / ASSESSMENT AND PLAN / ED COURSE  Pertinent labs & imaging results that were available during my care of the patient were reviewed by me and considered in my medical decision making (see chart for details).  ----------------------------------------- 11:51 PM on 01/28/2015 -----------------------------------------  Patient is pending urinalysis this time.  Discussed case with the patient's grandson, Sandria SenterCharlie Hedgepeth who says he will be in to the emergency department later in the evening to make sure that his grandmother is at her baseline. She is able to slightly open her eyes upon command but there is swelling around her orbits which is inhibiting this. She is able to respond her name. This appears to be a baseline mental status but it is difficult to assess secondary to the patient's language barrier. Signed out to Dr. Huel CoteQuigley. ____________________________________________   FINAL CLINICAL IMPRESSION(S) / ED DIAGNOSES  Fall. Abrasion with frontal cephalohematoma.  Myrna Blazeravid Matthew Fulton Merry, MD 01/28/15 [phone]

## 2015-01-28 NOTE — ED Notes (Signed)
Pt to rm 17 via EMS from [facility].  EMS report pt fell forward out of wheelchair, hitting head, hematoma to forehead.  Facility reports decreased LOC since fall.  Pt opening eyes but not responding verbally.  EMS unsure how much english pt speaks, first language is [place].  PT NAD upon arrival.

## 2015-01-28 NOTE — ED Notes (Signed)
Pt resting quietly with eyes closed at this time. Respirations are even and unlabored. Pt responds to voice. No acute distress noted. Will continue to monitor.

## 2015-01-28 NOTE — ED Notes (Signed)
Pt presents to ED via EMS from [facility] after falling forward out of her wheelchair this evening. Pt presents with hematoma and small laceration to left forehead and extensive bruising across left eye and into right eye area. Pt is awake, responsive to voice. Per facility, pt's primary language is [place] and of note, does not speak much [place]. Pt with sling in place to left arm after recent fracture and subsequent issue with the cast. Pt is resting quietly on stretcher during assessment. Respirations are even and unlabored. No acute distress noted. Will continue to monitor.

## 2015-01-28 NOTE — ED Notes (Signed)
Pt to CT at this time.

## 2015-01-29 DIAGNOSIS — S0083XA Contusion of other part of head, initial encounter: Secondary | ICD-10-CM | POA: Diagnosis not present

## 2015-01-29 LAB — URINALYSIS COMPLETE WITH MICROSCOPIC (ARMC ONLY)
BILIRUBIN URINE: NEGATIVE
GLUCOSE, UA: NEGATIVE mg/dL
KETONES UR: NEGATIVE mg/dL
NITRITE: POSITIVE — AB
PH: 5 (ref 5.0–8.0)
PROTEIN: 100 mg/dL — AB
SPECIFIC GRAVITY, URINE: 1.01 (ref 1.005–1.030)
Squamous Epithelial / LPF: NONE SEEN

## 2015-01-29 MED ORDER — NITROFURANTOIN MONOHYD MACRO 100 MG PO CAPS: 100.0000 mg | ORAL_CAPSULE | Freq: Two times a day (BID) | ORAL | Status: AC

## 2015-01-29 NOTE — ED Notes (Signed)
Pt assisted onto bedpan per family request. Will continue to monitor.

## 2015-01-29 NOTE — Discharge Instructions (Signed)
Urinary Tract Infection A urinary tract infection (UTI) can occur any place along the urinary tract. The tract includes the kidneys, ureters, bladder, and urethra. A type of germ called bacteria often causes a UTI. UTIs are often helped with antibiotic medicine.  HOME CARE   If given, take antibiotics as told by your doctor. Finish them even if you start to feel better.  Drink enough fluids to keep your pee (urine) clear or pale yellow.  Avoid tea, drinks with caffeine, and bubbly (carbonated) drinks.  Pee often. Avoid holding your pee in for a long time.  Pee before and after having sex (intercourse).  Wipe from front to back after you poop (bowel movement) if you are a woman. Use each tissue only once. GET HELP RIGHT AWAY IF:   You have back pain.  You have lower belly (abdominal) pain.  You have chills.  You feel sick to your stomach (nauseous).  You throw up (vomit).  Your burning or discomfort with peeing does not go away.  You have a fever.  Your symptoms are not better in 3 days. MAKE SURE YOU:   Understand these instructions.  Will watch your condition.  Will get help right away if you are not doing well or get worse.   This information is not intended to replace advice given to you by your health care provider. Make sure you discuss any questions you have with your health care provider.   Document Released: 07/31/2007 Document Revised: 03/04/2014 Document Reviewed: 09/12/2011 Elsevier Interactive Patient Education 2016 Elsevier Inc.  Hematoma A hematoma is a collection of blood under the skin, in an organ, in a body space, in a joint space, or in other tissue. The blood can clot to form a lump that you can see and feel. The lump is often firm and may sometimes become sore and tender. Most hematomas get better in a few days to weeks. However, some hematomas may be serious and require medical care. Hematomas can range in size from very small to very  large. CAUSES  A hematoma can be caused by a blunt or penetrating injury. It can also be caused by spontaneous leakage from a blood vessel under the skin. Spontaneous leakage from a blood vessel is more likely to occur in older people, especially those taking blood thinners. Sometimes, a hematoma can develop after certain medical procedures. SIGNS AND SYMPTOMS   A firm lump on the body.  Possible pain and tenderness in the area.  Bruising.Blue, dark blue, purple-red, or yellowish skin may appear at the site of the hematoma if the hematoma is close to the surface of the skin. For hematomas in deeper tissues or body spaces, the signs and symptoms may be subtle. For example, an intra-abdominal hematoma may cause abdominal pain, weakness, fainting, and shortness of breath. An intracranial hematoma may cause a headache or symptoms such as weakness, trouble speaking, or a change in consciousness. DIAGNOSIS  A hematoma can usually be diagnosed based on your medical history and a physical exam. Imaging tests may be needed if your health care provider suspects a hematoma in deeper tissues or body spaces, such as the abdomen, head, or chest. These tests may include ultrasonography or a CT scan.  TREATMENT  Hematomas usually go away on their own over time. Rarely does the blood need to be drained out of the body. Large hematomas or those that may affect vital organs will sometimes need surgical drainage or monitoring. HOME CARE INSTRUCTIONS   Apply  ice to the injured area:   Put ice in a plastic bag.   Place a towel between your skin and the bag.   Leave the ice on for 20 minutes, 2-3 times a day for the first 1 to 2 days.   After the first 2 days, switch to using warm compresses on the hematoma.   Elevate the injured area to help decrease pain and swelling. Wrapping the area with an elastic bandage may also be helpful. Compression helps to reduce swelling and promotes shrinking of the  hematoma. Make sure the bandage is not wrapped too tight.   If your hematoma is on a lower extremity and is painful, crutches may be helpful for a couple days.   Only take over-the-counter or prescription medicines as directed by your health care provider. SEEK IMMEDIATE MEDICAL CARE IF:   You have increasing pain, or your pain is not controlled with medicine.   You have a fever.   You have worsening swelling or discoloration.   Your skin over the hematoma breaks or starts bleeding.   Your hematoma is in your chest or abdomen and you have weakness, shortness of breath, or a change in consciousness.  Your hematoma is on your scalp (caused by a fall or injury) and you have a worsening headache or a change in alertness or consciousness. MAKE SURE YOU:   Understand these instructions.  Will watch your condition.  Will get help right away if you are not doing well or get worse.   This information is not intended to replace advice given to you by your health care provider. Make sure you discuss any questions you have with your health care provider.   Document Released: 09/26/2003 Document Revised: 10/14/2012 Document Reviewed: 07/22/2012 Elsevier Interactive Patient Education [employer]

## 2015-01-29 NOTE — ED Provider Notes (Signed)
-----------------------------------------   2:20 AM on 01/29/2015 -----------------------------------------   Blood pressure 106/66, pulse 82, temperature 98.4 F (36.9 C), temperature source Oral, resp. rate 18, height 5' (1.524 m), weight 120 lb (54.432 kg), SpO2 97 %.  Assuming care from Dr. Langston MaskerShaevitz.  In short, Briana Shaffer is a 81 y.o. female with a chief complaint of Fall and Altered Mental Status .  Refer to the original H&P for additional details.  The current plan of care is to follow the patient's urinalysis. She was difficult catheterization but the nursing staff was able to obtain an in and out catheterization which does show results consistent with the urinary tract infection. Urine culture was added that, the patient does not appear to be septic currently afebrile. Her x-ray evaluations have been negative for head trauma, etc. Her family is here and states she is at her baseline mental status. Patient was prescribed Macrodantin for urinary tract infection and instructions were to follow up with her primary physician and return for any new concerns.   Jennye MoccasinBrian S Abdelaziz Westenberger, MD 01/29/15 [phone]

## 2015-01-29 NOTE — ED Notes (Signed)
Attempted to catheterize pt for urine sample (ok per Dr. Pershing ProudSchaevitz). Cath unsuccessful. Will reattempt.

## 2015-01-29 NOTE — ED Notes (Signed)
Per grandson, Colfaxharlie Woolworth, who is at bedside, pt is operating at baseline mental status at this time. Pt's grandson able to converse with pt in [place] and reports that pt is answering all questions as she normally would. Pt is awake and alert, speaking with grandson. No acute distress noted at this time. Will continue to monitor.

## 2015-01-29 NOTE — ED Notes (Signed)
Report given to Leota JacobsenMary Walton at [facility].

## 2015-02-01 LAB — URINE CULTURE

## 2015-04-16 ENCOUNTER — Emergency Department: Payer: Medicare Other

## 2015-04-16 ENCOUNTER — Inpatient Hospital Stay
Admission: EM | Admit: 2015-04-16 | Discharge: 2015-04-19 | DRG: 871 | Payer: Medicare Other | Attending: Internal Medicine | Admitting: Internal Medicine

## 2015-04-16 ENCOUNTER — Encounter: Payer: Self-pay | Admitting: Emergency Medicine

## 2015-04-16 DIAGNOSIS — N170 Acute kidney failure with tubular necrosis: Secondary | ICD-10-CM | POA: Diagnosis present

## 2015-04-16 DIAGNOSIS — R410 Disorientation, unspecified: Secondary | ICD-10-CM | POA: Diagnosis not present

## 2015-04-16 DIAGNOSIS — F0280 Dementia in other diseases classified elsewhere without behavioral disturbance: Secondary | ICD-10-CM | POA: Diagnosis present

## 2015-04-16 DIAGNOSIS — Z79899 Other long term (current) drug therapy: Secondary | ICD-10-CM

## 2015-04-16 DIAGNOSIS — R6521 Severe sepsis with septic shock: Secondary | ICD-10-CM | POA: Diagnosis present

## 2015-04-16 DIAGNOSIS — E785 Hyperlipidemia, unspecified: Secondary | ICD-10-CM | POA: Diagnosis present

## 2015-04-16 DIAGNOSIS — I214 Non-ST elevation (NSTEMI) myocardial infarction: Secondary | ICD-10-CM | POA: Diagnosis present

## 2015-04-16 DIAGNOSIS — R6520 Severe sepsis without septic shock: Secondary | ICD-10-CM

## 2015-04-16 DIAGNOSIS — A419 Sepsis, unspecified organism: Secondary | ICD-10-CM | POA: Diagnosis present

## 2015-04-16 DIAGNOSIS — Z66 Do not resuscitate: Secondary | ICD-10-CM | POA: Diagnosis present

## 2015-04-16 DIAGNOSIS — J159 Unspecified bacterial pneumonia: Secondary | ICD-10-CM | POA: Diagnosis present

## 2015-04-16 DIAGNOSIS — R0902 Hypoxemia: Secondary | ICD-10-CM | POA: Diagnosis present

## 2015-04-16 DIAGNOSIS — N390 Urinary tract infection, site not specified: Secondary | ICD-10-CM | POA: Diagnosis present

## 2015-04-16 DIAGNOSIS — Z79891 Long term (current) use of opiate analgesic: Secondary | ICD-10-CM

## 2015-04-16 DIAGNOSIS — E872 Acidosis: Secondary | ICD-10-CM | POA: Diagnosis present

## 2015-04-16 DIAGNOSIS — I119 Hypertensive heart disease without heart failure: Secondary | ICD-10-CM | POA: Diagnosis present

## 2015-04-16 DIAGNOSIS — L89151 Pressure ulcer of sacral region, stage 1: Secondary | ICD-10-CM | POA: Diagnosis present

## 2015-04-16 DIAGNOSIS — I4891 Unspecified atrial fibrillation: Secondary | ICD-10-CM | POA: Diagnosis not present

## 2015-04-16 DIAGNOSIS — B9689 Other specified bacterial agents as the cause of diseases classified elsewhere: Secondary | ICD-10-CM | POA: Diagnosis present

## 2015-04-16 DIAGNOSIS — L8990 Pressure ulcer of unspecified site, unspecified stage: Secondary | ICD-10-CM | POA: Insufficient documentation

## 2015-04-16 DIAGNOSIS — E119 Type 2 diabetes mellitus without complications: Secondary | ICD-10-CM | POA: Diagnosis present

## 2015-04-16 DIAGNOSIS — G309 Alzheimer's disease, unspecified: Secondary | ICD-10-CM | POA: Diagnosis present

## 2015-04-16 LAB — CBC WITH DIFFERENTIAL/PLATELET
Basophils Absolute: 0 10*3/uL (ref 0–0.1)
Basophils Relative: 0 %
EOS ABS: 0 10*3/uL (ref 0–0.7)
Eosinophils Relative: 0 %
HEMATOCRIT: 27.3 % — AB (ref 35.0–47.0)
HEMOGLOBIN: 9 g/dL — AB (ref 12.0–16.0)
LYMPHS ABS: 1.3 10*3/uL (ref 1.0–3.6)
LYMPHS PCT: 19 %
MCH: 29.1 pg (ref 26.0–34.0)
MCHC: 32.8 g/dL (ref 32.0–36.0)
MCV: 88.7 fL (ref 80.0–100.0)
Monocytes Absolute: 0.5 10*3/uL (ref 0.2–0.9)
Monocytes Relative: 7 %
NEUTROS ABS: 5.1 10*3/uL (ref 1.4–6.5)
NEUTROS PCT: 74 %
Platelets: 219 10*3/uL (ref 150–440)
RBC: 3.08 MIL/uL — AB (ref 3.80–5.20)
RDW: 14.9 % — ABNORMAL HIGH (ref 11.5–14.5)
WBC: 6.8 10*3/uL (ref 3.6–11.0)

## 2015-04-16 LAB — COMPREHENSIVE METABOLIC PANEL
ALT: 13 U/L — ABNORMAL LOW (ref 14–54)
ANION GAP: 7 (ref 5–15)
AST: 47 U/L — ABNORMAL HIGH (ref 15–41)
Albumin: 2.5 g/dL — ABNORMAL LOW (ref 3.5–5.0)
Alkaline Phosphatase: 113 U/L (ref 38–126)
BUN: 43 mg/dL — ABNORMAL HIGH (ref 6–20)
CHLORIDE: 102 mmol/L (ref 101–111)
CO2: 32 mmol/L (ref 22–32)
CREATININE: 2.19 mg/dL — AB (ref 0.44–1.00)
Calcium: 7.7 mg/dL — ABNORMAL LOW (ref 8.9–10.3)
GFR, EST AFRICAN AMERICAN: 21 mL/min — AB (ref >60)
GFR, EST NON AFRICAN AMERICAN: 18 mL/min — AB (ref >60)
Glucose, Bld: 208 mg/dL — ABNORMAL HIGH (ref 65–99)
POTASSIUM: 3.9 mmol/L (ref 3.5–5.1)
Sodium: 141 mmol/L (ref 135–145)
Total Bilirubin: 1.2 mg/dL (ref 0.3–1.2)
Total Protein: 5.7 g/dL — ABNORMAL LOW (ref 6.5–8.1)

## 2015-04-16 LAB — EXPECTORATED SPUTUM ASSESSMENT W REFEX TO RESP CULTURE: SPECIAL REQUESTS: NORMAL

## 2015-04-16 LAB — URINALYSIS COMPLETE WITH MICROSCOPIC (ARMC ONLY)
BACTERIA UA: NONE SEEN
Bilirubin Urine: NEGATIVE
Glucose, UA: NEGATIVE mg/dL
KETONES UR: NEGATIVE mg/dL
LEUKOCYTES UA: NEGATIVE
Nitrite: NEGATIVE
PH: 6 (ref 5.0–8.0)
RBC / HPF: NONE SEEN RBC/hpf (ref 0–5)
SQUAMOUS EPITHELIAL / LPF: NONE SEEN
Specific Gravity, Urine: 1.01 (ref 1.005–1.030)
WBC, UA: NONE SEEN WBC/hpf (ref 0–5)

## 2015-04-16 LAB — LACTIC ACID, PLASMA
LACTIC ACID, VENOUS: 2.3 mmol/L — AB (ref 0.5–2.0)
LACTIC ACID, VENOUS: 2.2 mmol/L — AB (ref 0.5–2.0)

## 2015-04-16 LAB — RAPID INFLUENZA A&B ANTIGENS: Influenza B (ARMC): NEGATIVE

## 2015-04-16 LAB — TROPONIN I
TROPONIN I: 3.97 ng/mL — AB (ref <0.031)
TROPONIN I: 3.64 ng/mL — AB (ref <0.031)
Troponin I: 6 ng/mL — ABNORMAL HIGH (ref <0.031)

## 2015-04-16 LAB — EXPECTORATED SPUTUM ASSESSMENT W GRAM STAIN, RFLX TO RESP C

## 2015-04-16 LAB — MRSA PCR SCREENING: MRSA by PCR: NEGATIVE

## 2015-04-16 LAB — APTT: aPTT: 44 seconds — ABNORMAL HIGH (ref 24–36)

## 2015-04-16 LAB — RAPID INFLUENZA A&B ANTIGENS (ARMC ONLY): INFLUENZA A (ARMC): NEGATIVE

## 2015-04-16 MED ORDER — GLYCOPYRROLATE 0.2 MG/ML IJ SOLN
0.5000 mg | Freq: Once | INTRAMUSCULAR | Status: DC
  Filled 2015-04-16: qty 3

## 2015-04-16 MED ORDER — PIPERACILLIN-TAZOBACTAM 3.375 G IVPB
3.3750 g | Freq: Once | INTRAVENOUS | Status: AC
  Administered 2015-04-16: 3.375 g via INTRAVENOUS
  Filled 2015-04-16: qty 50

## 2015-04-16 MED ORDER — ASPIRIN 300 MG RE SUPP
300.0000 mg | Freq: Every day | RECTAL | Status: DC
Start: 2015-04-16 — End: 2015-04-18
  Administered 2015-04-16 – 2015-04-18 (×3): 300 mg via RECTAL
  Filled 2015-04-16 (×3): qty 1

## 2015-04-16 MED ORDER — HEPARIN SODIUM (PORCINE) 5000 UNIT/ML IJ SOLN
5000.0000 [IU] | Freq: Three times a day (TID) | INTRAMUSCULAR | Status: DC
  Administered 2015-04-16 – 2015-04-18 (×6): 5000 [IU] via SUBCUTANEOUS
  Filled 2015-04-16 (×6): qty 1

## 2015-04-16 MED ORDER — SODIUM CHLORIDE 0.9 % IV SOLN
INTRAVENOUS | Status: DC
  Administered 2015-04-16: 17:00:00 via INTRAVENOUS

## 2015-04-16 MED ORDER — SODIUM CHLORIDE 0.9 % IV BOLUS (SEPSIS)
2000.0000 mL | Freq: Once | INTRAVENOUS | Status: AC
  Administered 2015-04-16: 2000 mL via INTRAVENOUS

## 2015-04-16 MED ORDER — ACETAMINOPHEN 650 MG RE SUPP: 650.0000 mg | Freq: Four times a day (QID) | RECTAL | Status: DC | PRN

## 2015-04-16 MED ORDER — ONDANSETRON HCL 4 MG PO TABS: 4.0000 mg | ORAL_TABLET | Freq: Four times a day (QID) | ORAL | Status: DC | PRN

## 2015-04-16 MED ORDER — ACETAMINOPHEN 325 MG PO TABS: 650.0000 mg | ORAL_TABLET | Freq: Four times a day (QID) | ORAL | Status: DC | PRN

## 2015-04-16 MED ORDER — ALBUTEROL SULFATE (2.5 MG/3ML) 0.083% IN NEBU
2.5000 mg | INHALATION_SOLUTION | Freq: Four times a day (QID) | RESPIRATORY_TRACT | Status: DC | PRN
  Administered 2015-04-17 (×2): 2.5 mg via RESPIRATORY_TRACT
  Filled 2015-04-16 (×2): qty 3

## 2015-04-16 MED ORDER — GLYCOPYRROLATE 0.2 MG/ML IJ SOLN
0.2000 mg | Freq: Once | INTRAMUSCULAR | Status: AC
  Administered 2015-04-16: 0.2 mg via INTRAVENOUS

## 2015-04-16 MED ORDER — VANCOMYCIN HCL IN DEXTROSE 750-5 MG/150ML-% IV SOLN
750.0000 mg | INTRAVENOUS | Status: DC
  Filled 2015-04-16: qty 150

## 2015-04-16 MED ORDER — ONDANSETRON HCL 4 MG/2ML IJ SOLN: 4.0000 mg | Freq: Four times a day (QID) | INTRAMUSCULAR | Status: DC | PRN

## 2015-04-16 MED ORDER — PIPERACILLIN-TAZOBACTAM 3.375 G IVPB
3.3750 g | Freq: Two times a day (BID) | INTRAVENOUS | Status: DC
  Administered 2015-04-17 – 2015-04-18 (×4): 3.375 g via INTRAVENOUS
  Filled 2015-04-16 (×5): qty 50

## 2015-04-16 MED ORDER — ASPIRIN 300 MG RE SUPP
300.0000 mg | Freq: Once | RECTAL | Status: AC
  Administered 2015-04-16: 300 mg via RECTAL
  Filled 2015-04-16: qty 1

## 2015-04-16 MED ORDER — VANCOMYCIN HCL IN DEXTROSE 1-5 GM/200ML-% IV SOLN
1000.0000 mg | Freq: Once | INTRAVENOUS | Status: AC
  Administered 2015-04-16: 1000 mg via INTRAVENOUS
  Filled 2015-04-16: qty 200

## 2015-04-16 NOTE — Progress Notes (Signed)
RT to room to NT suction patient.  Patient audible rhonchi, suctioned down each nare times one with large return of thick yellow secretions.  Patient O2 sat monitored throughout, sats remained in mid-high 90's.  Patient tolerated NT suction well.  Will continue to monitor.

## 2015-04-16 NOTE — ED Notes (Signed)
Patient brought in by [facility] from [facility] for evaluation of possible pneumonia and sepsis

## 2015-04-16 NOTE — H&P (Signed)
[facility] - [place] [facility]   PATIENT NAME: Briana Shaffer    MR#:  [id number]  DATE OF BIRTH:  [birth year]  DATE OF ADMISSION:  04/16/2015  PRIMARY CARE PHYSICIAN: Derwood Kaplan, MD   REQUESTING/REFERRING PHYSICIAN: Dr. Fanny Bien  CHIEF COMPLAINT:  Altered mental status, lethargy, fever, hypoxia Patient has advanced dementia. She is a poor historian. She presents from [facility] with double chief complaint History is obtained from ER physician and RN.  HISTORY OF PRESENT ILLNESS:  Briana Shaffer  is a 82 y.o. female with a known history of dementia, hypertension, hyperlipidemia comes to the emergency room from [facility] with altered mental status lethargic fever and hypoxia.  Patient was found to be septic with left lower lobe pneumonia and severe complicated UTI She also has elevated creatinine with acute renal failure creatinine today is 2.19 baseline creatinine is 1.2 Patient was also noted to have elevated troponin of 6 She is being admitted with multiorgan failure and septic shock and acute non-Q-wave MI She received IV fluids and vancomycin with Zosyn. Patient also was found to be hypoxic with sats in the 87-88. Lot of thick yellowish secretions were suctioned by respiratory therapist. Patient has significant coarse crackles bilaterally on auscultation. Chest x-ray shows left lower lobe consolidation  PAST MEDICAL HISTORY:   Past Medical History  Diagnosis Date  . Diabetes mellitus without complication (HCC)   . Hypertension   . Hyperlipemia     PAST SURGICAL HISTOIRY:  No past surgical history on file.  SOCIAL HISTORY:   Social History  Substance Use Topics  . Smoking status: Never Smoker   . Smokeless tobacco: Never Used  . Alcohol Use: No    FAMILY HISTORY:  Unable to provide due to severe dementia  DRUG ALLERGIES:  No Known Allergies  REVIEW OF SYSTEMS:  Review of Systems  Unable to perform ROS: dementia      MEDICATIONS AT HOME:   Prior to Admission medications   Medication Sig Start Date End Date Taking? Authorizing Provider  acetaminophen (TYLENOL) 325 MG tablet Take 650 mg by mouth every 6 (six) hours as needed for mild pain or moderate pain. Routine pain less than 4.   Yes Historical Provider, MD  acetaminophen (TYLENOL) 500 MG tablet Take 500 mg by mouth every 6 (six) hours as needed for fever. *Take for fever over 101* 04/15/15 04/27/15 Yes Historical Provider, MD  guaiFENesin-dextromethorphan (ROBITUSSIN DM) 100-10 MG/5ML syrup Take 10 mLs by mouth every 6 (six) hours as needed for cough. 04/15/15 04/27/15 Yes Historical Provider, MD  HYDROcodone-acetaminophen (NORCO/VICODIN) 5-325 MG tablet Take 1 tablet by mouth See admin instructions. Take 1 tablet by mouth every 6 hours for pain for right arm fracture and take 1 tablet by mouth every 6 hours as needed for pain.   Yes Historical Provider, MD  ipratropium-albuterol (DUONEB) 0.5-2.5 (3) MG/3ML SOLN Take 3 mLs by nebulization every 4 (four) hours as needed (for congestion.).   Yes Historical Provider, MD  lamoTRIgine (LAMICTAL) 25 MG tablet Take 50 mg by mouth 2 (two) times daily.    Yes Historical Provider, MD  omeprazole (PRILOSEC OTC) 20 MG tablet Take 20 mg by mouth daily.   Yes Historical Provider, MD  oseltamivir (TAMIFLU) 75 MG capsule Take 75 mg by mouth daily. 04/16/15 04/27/15 Yes Historical Provider, MD  QUEtiapine (SEROQUEL) 100 MG tablet Take 100 mg by mouth daily. *note dose*   Yes Historical Provider, MD  QUEtiapine (SEROQUEL) 50 MG  tablet Take 50 mg by mouth 2 (two) times daily. *note dose*   Yes Historical Provider, MD  traMADol (ULTRAM) 50 MG tablet Take 50 mg by mouth every 12 (twelve) hours as needed for moderate pain or severe pain. Take when pain is greater than 4 but less than 8.   Yes Historical Provider, MD      VITAL SIGNS:  Blood pressure 117/52, pulse 128, temperature 98 F (36.7 C), resp. rate 35, height  (1.499  m), weight 45.632 kg (100 lb 9.6 oz), SpO2 100 %.  PHYSICAL EXAMINATION:  GENERAL:  82 y.o.-year-old patient lying in the bed with mild acute distress.  EYES: Pupils equal, round, reactive to light and accommodation. No scleral icterus. Extraocular muscles intact.  HEENT: Head atraumatic, normocephalic. Oropharynx and nasopharynx clear. On nonrebreather NECK:  Supple, no jugular venous distention. No thyroid enlargement, no tenderness.  LUNGS: Decreased  breath sounds bilaterally, no wheezing, rales, coarse rhonchi +++ bilaterally. No use of accessory muscles of respiration.  CARDIOVASCULAR: S1, S2 normal. No murmurs, rubs, or gallops. Tachycardia ABDOMEN: Soft, nontender, nondistended. Bowel sounds present. No organomegaly or mass. Foley catheter with pyuria EXTREMITIES: 2+ pedal edema, cyanosis, or clubbing.  NEUROLOGIC: Unable to assess secondary to severe dementia and critical illness  PSYCHIATRIC:  patient is lethargic and unable to communicate SKIN: No obvious rash, lesion, or ulcer.   LABORATORY PANEL:   CBC  Recent Labs Lab 04/16/15 1359  WBC 6.8  HGB 9.0*  HCT 27.3*  PLT 219   ------------------------------------------------------------------------------------------------------------------  Chemistries   Recent Labs Lab 04/16/15 1359  NA 141  K 3.9  CL 102  CO2 32  GLUCOSE 208*  BUN 43*  CREATININE 2.19*  CALCIUM 7.7*  AST 47*  ALT 13*  ALKPHOS 113  BILITOT 1.2   ------------------------------------------------------------------------------------------------------------------  Cardiac Enzymes  Recent Labs Lab 04/16/15 1359  TROPONINI 6.00*   ------------------------------------------------------------------------------------------------------------------  RADIOLOGY:  Dg Chest Port 1 View  04/16/2015  CLINICAL DATA:  Probable sepsis, known UTI, pt combative and unresponsive to questions or commands. EXAM: PORTABLE CHEST 1 VIEW COMPARISON:   01/28/2015 FINDINGS: Heart is enlarged. There has been development of significant opacity at the left lung base. There is pulmonary vascular congestion. Probable mild interstitial edema. Aorta is densely calcified. IMPRESSION: Left lower lobe infiltrate.  Cardiomegaly. Electronically Signed   By: Norva Pavlov M.D.   On: 04/16/2015 14:53    EKG:  S tachycardia  IMPRESSION AND PLAN:  Brennen Gardiner  is a 82 y.o. female with a known history of dementia, hypertension, hyperlipidemia comes to the emergency room from [facility] with altered mental status lethargic fever and hypoxia.  Patient was found to be septic with left lower lobe pneumonia and severe complicated UTI  1. severe sepsis secondary to left lower lobe pneumonia and severe UTI with acute renal failure -Admit to ICU -Patient is a no code DO NOT RESUSCITATE this was discussed with extended family present in the emergency room -IV vancomycin and Zosyn -IV fluids for hydration -Vasopressors if needed -Follow blood cultures urine culture and sputum culture -Pulmonary consultation -As needed breathing treatments  2. Acute non-Q-wave MI in the setting of severe sepsis -Family does not want aggressive treatment -They understand patient is critically ill and may not make this hospitalization -We will hold off on IV heparin drip. Case was discussed with Dr. Juliann Pares given critical illness patient not a candidate for any aggressive intervention at this time -We'll give aspirin per rectally -Due to sepsis and  low blood pressure unable to give beta blockers  3. Acute renal failure appears ATN in the setting of severe sepsis -IV fluids for hydration -Monitor I's and O's, avoid nephrotoxins, consider nephrology consultation if needed  4. Severe dementia  5. Acidosis due to severe sepsis -Treatment as above  6. DVT prophylaxis subcutaneous heparin  Discussed with patient's extended family present emergency room. Patient is a no  code DO NOT RESUSCITATE. Family is leaning towards comfort measures if patient does not show any improvement in next 24-36 hours. Consider palliative care consultation.    All the records are reviewed and case discussed with ED provider. Management plans discussed with the patient, family and they are in agreement.  CODE STATUS: DO NOT RESUSCITATE  TOTALCRITICAL  TIME TAKING CARE OF THIS PATIENT: 55  minutes.    Oreoluwa Aigner M.D on 04/16/2015 at 4:00 PM  Between 7am to 6pm - Pager - [phone]  After 6pm go to [email or web address] - password [email or web address] [facility]  [facility]  Office  [phone]  CC: Primary care physician; Derwood Kaplan, MD

## 2015-04-16 NOTE — ED Provider Notes (Signed)
[facility] Emergency Department Provider Note  ____________________________________________  Time seen: Approximately 2:02 PM  I have reviewed the triage vital signs and the nursing notes.   HISTORY  Chief Complaint Respiratory Distress  EM caveat: The patient confused and unable to provide history  HPI Briana Shaffer is a 82 y.o. female history dementia, hypertension, Alzheimer's, diabetes and frequent urinary tract infection.  EMS reports patient remained recently ill. Patient has developed trouble breathing, fever. There is concern about possible pneumonia per the transferring physician/nursing staff.  At the time of arrival approximately 1:50 PM, a cold and is able to reach the patient's primary responsible party, grandson Vincent. Conveyed acuity his symptoms and my concern for life-threatening illness and overwhelming infection. He advises that he will be coming to the hospital, and notifying the patient's family including her son. The present time she is full code.  80% on room air with EMS.  Past Medical History  Diagnosis Date  . Diabetes mellitus without complication (HCC)   . Hypertension   . Hyperlipemia     Patient Active Problem List   Diagnosis Date Noted  . Onychomycosis 09/06/2014  . Pain in lower limb 09/06/2014    No past surgical history on file.  Current Outpatient Rx  Name  Route  Sig  Dispense  Refill  . acetaminophen (TYLENOL) 325 MG tablet   Oral   Take 650 mg by mouth every 6 (six) hours as needed.         . lamoTRIgine (LAMICTAL) 25 MG tablet   Oral   Take 25 mg by mouth 2 (two) times daily.         . QUEtiapine (SEROQUEL) 50 MG tablet   Oral   Take 50 mg by mouth 3 (three) times daily.         . traMADol (ULTRAM) 50 MG tablet   Oral   Take 50 mg by mouth every 12 (twelve) hours as needed.           Allergies Review of patient's allergies indicates no known allergies.  No family history on  file.  Social History Social History  Substance Use Topics  . Smoking status: Never Smoker   . Smokeless tobacco: Never Used  . Alcohol Use: No    Review of Systems Fever cough and trouble breathing. EM caveat  ____________________________________________   PHYSICAL EXAM:  VITAL SIGNS: ED Triage Vitals  Enc Vitals Group     BP 04/16/15 1354 87/45 mmHg     Pulse Rate 04/16/15 1354 106     Resp 04/16/15 1354 27     Temp --      Temp src --      SpO2 04/16/15 1354 88 %     Weight 04/16/15 1354 100 lb 9.6 oz (45.632 kg)     Height 04/16/15 1354  (1.499 m)     Head Cir --      Peak Flow --      Pain Score --      Pain Loc --      Pain Edu? --      Excl. in GC? --    Constitutional: Somnolent with frequent rhonchorous cough. Productive. The patient does respond to verbal stimuli, she is protecting her airway coughing with good reflexes. She does open her eyes spontaneously, but is not able to provide reasonable or consistent history. Eyes: Conjunctivae are normal. PERRL. EOMI. Head: Atraumatic. Edentulous. Nose: No congestion/rhinnorhea. Mouth/Throat: Mucous membranes are  very dry.  Oropharynx non-erythematous. Neck: No stridor.   Cardiovascular: Tachycardic rate, regular rhythm. Grossly normal heart sounds.  Good peripheral circulation. Respiratory: Moderate increased work of breathing. Frequent rails centrally/rhonchi. Upper lungs clear to auscultation. Patient demonstrates mild use of accessory muscles and mild tachypnea. There is no and expiratory wheezing. Patient does appear to be adequately respiratory, but is coughing frequently with some trouble clearing secretions. Gastrointestinal: Soft and nontender. No distention.  Musculoskeletal: No lower extremity tenderness nor edema.  Moves all extremities. Generalized weakness, symmetric. Neurologic:  The patient occasionally moans, resists examination, but does not verbalize. She does not follow commands, but does  perform intentional movement such as picking at IVs or resisting exam. Skin:  Skin is warm, dry and intact. No rash noted.   ____________________________________________   LABS (all labs ordered are listed, but only abnormal results are displayed)  Labs Reviewed  COMPREHENSIVE METABOLIC PANEL - Abnormal; Notable for the following:    Glucose, Bld 208 (*)    BUN 43 (*)    Creatinine, Ser 2.19 (*)    Calcium 7.7 (*)    Total Protein 5.7 (*)    Albumin 2.5 (*)    AST 47 (*)    ALT 13 (*)    GFR calc non Af Amer 18 (*)    GFR calc Af Amer 21 (*)    All other components within normal limits  CBC WITH DIFFERENTIAL/PLATELET - Abnormal; Notable for the following:    RBC 3.08 (*)    Hemoglobin 9.0 (*)    HCT 27.3 (*)    RDW 14.9 (*)    All other components within normal limits  LACTIC ACID, PLASMA - Abnormal; Notable for the following:    Lactic Acid, Venous 2.3 (*)    All other components within normal limits  TROPONIN I - Abnormal; Notable for the following:    Troponin I 6.00 (*)    All other components within normal limits  CULTURE, BLOOD (ROUTINE X 2)  CULTURE, BLOOD (ROUTINE X 2)  URINE CULTURE  RAPID INFLUENZA A&B ANTIGENS ([facility] ONLY)  LACTIC ACID, PLASMA  URINALYSIS COMPLETEWITH MICROSCOPIC ([facility] ONLY)  APTT  APTT   ____________________________________________  EKG  Reviewed and interpreted by me at 1400 Heart rate 105 QRS 70 QTc 460 Probable low amplitude P waves are noted corresponding to each complex, but do not believe there is evidence of third-degree heart block. No evidence of clear acute ischemic change.  A repeat EKG performed at 1410 Sinus tachycardia, P waves now much more evident PR 160 QTc 450 There is some nonspecific depression oted in lead 2, clearly no ST elevation or obvious ischemic change to suggest an acute coronary lesion. ____________________________________________  RADIOLOGY   DG Chest Port 1 View (Final result) Result time:  04/16/15 14:53:13   Final result by Rad Results In Interface (04/16/15 14:53:13)   Narrative:   CLINICAL DATA: Probable sepsis, known UTI, pt combative and unresponsive to questions or commands.  EXAM: PORTABLE CHEST 1 VIEW  COMPARISON: 01/28/2015  FINDINGS: Heart is enlarged. There has been development of significant opacity at the left lung base. There is pulmonary vascular congestion. Probable mild interstitial edema. Aorta is densely calcified.  IMPRESSION: Left lower lobe infiltrate. Cardiomegaly.   Electronically Signed By: Norva Pavlov M.D. On: 04/16/2015 14:53    ____________________________________________   PROCEDURES  Procedure(s) performed: None  Critical Care performed: Yes, see critical care note(s)  CRITICAL CARE Performed by: Sharyn Creamer   Total critical care time:  45 minutes  Critical care time was exclusive of separately billable procedures and treating other patients.  Critical care was necessary to treat or prevent imminent or life-threatening deterioration.  Critical care was time spent personally by me on the following activities: development of treatment plan with patient and/or surrogate as well as nursing, discussions with consultants, evaluation of patient's response to treatment, examination of patient, obtaining history from patient or surrogate, ordering and performing treatments and interventions, ordering and review of laboratory studies, ordering and review of radiographic studies, pulse oximetry and re-evaluation of patient's condition. ----------------------------------------- 3:00 PM on 04/16/2015 -----------------------------------------  Patient's grandson and granddaughter notified me that they have discussed with family including the patient's daughters and son, the patient does wish to BE "DO NOT RESUSCITATE". They reported the patient would not want to be on a ventilator, not receive resuscitation or CPR, or any  heroic treatments. Therefore the patient had previously discussed this, but they did not know if there is any formal paperwork. Based upon this discussion, we will admit the patient to the hospitalist service and not plan to perform any heroic measures. I think this is very reasonable, suspect the patient is likely having severe myocardial ischemia possibly due to demand or non-ST elevation MI in the setting of severe sepsis secondary to large left-sided pneumonia. We will continue to treat with IV fluids, antibiotics and family reports that her primary goal at this point is to keep her as comfortable as possible. I think is a very reasonable decision and will follow-out on their wishes. ____________________________________________   INITIAL IMPRESSION / ASSESSMENT AND PLAN / ED COURSE  Pertinent labs & imaging results that were available during my care of the patient were reviewed by me and considered in my medical decision making (see chart for details).  Patient presents with fever via EMS, rhonchorous cough moderate hypotension and hypoxia. Extremely concerning for acute respiratory infection consider influenza, pneumonia. Patient initiated as "code sepsis". There is no evidence of acute neurologic deficit, though the patient is somnolent. Her respiratory status is tenuous, however she is protecting her airway and does appear to be respiratory adequately. End tidal CO2 is 30 with good waveform. Initiate fluid bolus, early antibiotics, obtain chest x-ray and will monitor very closely. The patient is at high risk for decompensation, but given doesn't status withhold intubation as she is adequately protecting her airway.  EKG does not show acute ischemic abdomen round. Labs sent including full code sepsis panel.  Family updated and are coming anticipate arrival at approximately 2:30 PM.  Case and care discussed with Dr. Allena Katz of the hospitalist service will be performing admission. The patient is  showing some improvement at this point, her blood pressure is improving, her respirations are improving and she is seemingly more comfortable. She is clearly in critical condition. ____________________________________________   FINAL CLINICAL IMPRESSION(S) / ED DIAGNOSES  Final diagnoses:  Severe sepsis (HCC)  Hospital-acquired bacterial pneumonia      Sharyn Creamer, MD 04/16/15 1503

## 2015-04-16 NOTE — ED Notes (Signed)
Patient brought in by EMS, on arrival to ED patient only responsive to pain, patient O2 sats 88% on room air, non re-breather applied to patient. With sats improving to 97%

## 2015-04-16 NOTE — Progress Notes (Signed)
RT called to room by Nia, RN to NT suction patient due to audible rhonchi and labored breathing.  RT suctioned patient down each nare times 2 and had large return of yellow thick secretions.  Patient extremely agitated during procedure, but no adverse effects noted.  O2 sats remained mid-high 90's throughout suctioning.  Patient would not resume wearing NRB initially and sats dropped to mid 80's.  Nia, RN replaced NRB and sats returned to high 90's.  RT will continue to monitor.

## 2015-04-16 NOTE — Progress Notes (Signed)
eLink Physician-Brief Progress Note Patient Name: Briana Shaffer DOB: [birth year] MRN: [id number]   Date of Service  04/16/2015  HPI/Events of Note  82 yo female with PMH of Dementia, HTN, HLD. Presents with ALOC, Lethargy, fever and hypoxia. Diagnosis - LLL pneumonia, cystitis, sepsis and NTEMI (troponin = 6).  Currently medical regimen includes ASA Supp, Robinul, Heparin , Albuterol PRN, Zosyn and Vancomycin. Note that the patient is a DNR. Temp = 95.9 F, BP = 119/57, HR = 128, O2 sat = 100% and RR = 18. Management per Hospitalist Service.   eICU Interventions  Continue present management.      Intervention Category Evaluation Type: New Patient Evaluation  Lenell Antu 04/16/2015, 5:45 PM

## 2015-04-16 NOTE — Progress Notes (Signed)
ANTIBIOTIC CONSULT NOTE - INITIAL  Pharmacy Consult for Vancomycin , Zosyn  Indication: sepsis  No Known Allergies  Patient Measurements: Height: 5' (152.4 cm) Weight: 106 lb 7.7 oz (48.3 kg) IBW/kg (Calculated) : 45.5 Adjusted Body Weight: 46.6 kg   Vital Signs: Temp: 95.9 F (35.5 C) (02/19 1624) BP: 119/57 mmHg (02/19 1624) Pulse Rate: 128 (02/19 1530) Intake/Output from previous day:   Intake/Output from this shift: Total I/O In: 2000 [I.V.:2000] Out: 250 [Urine:250]  Labs:  Recent Labs  04/16/15 1359  WBC 6.8  HGB 9.0*  PLT 219  CREATININE 2.19*   Estimated Creatinine Clearance: 11.3 mL/min (by C-G formula based on Cr of 2.19). No results for input(s): VANCOTROUGH, VANCOPEAK, VANCORANDOM, GENTTROUGH, GENTPEAK, GENTRANDOM, TOBRATROUGH, TOBRAPEAK, TOBRARND, AMIKACINPEAK, AMIKACINTROU, AMIKACIN in the last 72 hours.   Microbiology: Recent Results (from the past 720 hour(s))  Rapid Influenza A&B Antigens ([facility] only)     Status: None   Collection Time: 04/16/15  2:19 PM  Result Value Ref Range Status   Influenza A [facility]) NEGATIVE  Final   Influenza B [facility]) NEGATIVE  Final    Medical History: Past Medical History  Diagnosis Date  . Diabetes mellitus without complication (HCC)   . Hypertension   . Hyperlipemia     Medications:  Prescriptions prior to admission  Medication Sig Dispense Refill Last Dose  . acetaminophen (TYLENOL) 325 MG tablet Take 650 mg by mouth every 6 (six) hours as needed for mild pain or moderate pain. Routine pain less than 4.   unknown  . acetaminophen (TYLENOL) 500 MG tablet Take 500 mg by mouth every 6 (six) hours as needed for fever. *Take for fever over 101*   unknown  . guaiFENesin-dextromethorphan (ROBITUSSIN DM) 100-10 MG/5ML syrup Take 10 mLs by mouth every 6 (six) hours as needed for cough.   unknown  . HYDROcodone-acetaminophen (NORCO/VICODIN) 5-325 MG tablet Take 1 tablet by mouth See admin instructions. Take 1 tablet by  mouth every 6 hours for pain for right arm fracture and take 1 tablet by mouth every 6 hours as needed for pain.   unknown  . ipratropium-albuterol (DUONEB) 0.5-2.5 (3) MG/3ML SOLN Take 3 mLs by nebulization every 4 (four) hours as needed (for congestion.).   unknown  . lamoTRIgine (LAMICTAL) 25 MG tablet Take 50 mg by mouth 2 (two) times daily.    unknown  . omeprazole (PRILOSEC OTC) 20 MG tablet Take 20 mg by mouth daily.   unknown  . oseltamivir (TAMIFLU) 75 MG capsule Take 75 mg by mouth daily.   unknown  . QUEtiapine (SEROQUEL) 100 MG tablet Take 100 mg by mouth daily. *note dose*   unknown  . QUEtiapine (SEROQUEL) 50 MG tablet Take 50 mg by mouth 2 (two) times daily. *note dose*   unknown  . traMADol (ULTRAM) 50 MG tablet Take 50 mg by mouth every 12 (twelve) hours as needed for moderate pain or severe pain. Take when pain is greater than 4 but less than 8.   unknown   Assessment: CrCl = 11.3  Ke = 0.014 hr-1 T1/2 = 49.5 hrs Vd = 33.8 L    Goal of Therapy:  Vancomycin trough level 15-20 mcg/ml  Plan:  Expected duration 7 days with resolution of temperature and/or normalization of WBC  Zosyn 3.375 gm IV X 1 given in ED on 2/19 @ 14:00.  Zosyn 3.375 gm IV Q12H EI on 2/20 @ 2:00.   Vancomycin 1 gm IV X 1 given on 2/19 @  14:00.  Vancomycin 750 mg IV Q48H ordered to start 2/21 @ 16:00, ~ 50 hrs after 1st dose (stacked dosing).  This pt is in acute renal failure, renal function may improve over next few days requiring dose adjustment .    No trough currently ordered.   Landa Mullinax D 04/16/2015,5:10 PM

## 2015-04-17 DIAGNOSIS — R410 Disorientation, unspecified: Secondary | ICD-10-CM

## 2015-04-17 DIAGNOSIS — I4891 Unspecified atrial fibrillation: Secondary | ICD-10-CM

## 2015-04-17 DIAGNOSIS — A419 Sepsis, unspecified organism: Principal | ICD-10-CM

## 2015-04-17 DIAGNOSIS — L8990 Pressure ulcer of unspecified site, unspecified stage: Secondary | ICD-10-CM | POA: Insufficient documentation

## 2015-04-17 DIAGNOSIS — N390 Urinary tract infection, site not specified: Secondary | ICD-10-CM

## 2015-04-17 LAB — BASIC METABOLIC PANEL
ANION GAP: 7 (ref 5–15)
BUN: 44 mg/dL — ABNORMAL HIGH (ref 6–20)
CALCIUM: 7.1 mg/dL — AB (ref 8.9–10.3)
CO2: 25 mmol/L (ref 22–32)
CREATININE: 1.87 mg/dL — AB (ref 0.44–1.00)
Chloride: 110 mmol/L (ref 101–111)
GFR, EST AFRICAN AMERICAN: 25 mL/min — AB (ref >60)
GFR, EST NON AFRICAN AMERICAN: 22 mL/min — AB (ref >60)
GLUCOSE: 102 mg/dL — AB (ref 65–99)
Potassium: 3.8 mmol/L (ref 3.5–5.1)
Sodium: 142 mmol/L (ref 135–145)

## 2015-04-17 LAB — CBC
HCT: 24.7 % — ABNORMAL LOW (ref 35.0–47.0)
HEMOGLOBIN: 8.1 g/dL — AB (ref 12.0–16.0)
MCH: 29.3 pg (ref 26.0–34.0)
MCHC: 32.7 g/dL (ref 32.0–36.0)
MCV: 89.6 fL (ref 80.0–100.0)
PLATELETS: 209 10*3/uL (ref 150–440)
RBC: 2.76 MIL/uL — AB (ref 3.80–5.20)
RDW: 15.1 % — ABNORMAL HIGH (ref 11.5–14.5)
WBC: 4.9 10*3/uL (ref 3.6–11.0)

## 2015-04-17 LAB — GLUCOSE, CAPILLARY
Glucose-Capillary: 79 mg/dL (ref 65–99)
Glucose-Capillary: 92 mg/dL (ref 65–99)

## 2015-04-17 LAB — TROPONIN I: Troponin I: 3.22 ng/mL — ABNORMAL HIGH (ref <0.031)

## 2015-04-17 MED ORDER — HALOPERIDOL LACTATE 5 MG/ML IJ SOLN
2.0000 mg | Freq: Four times a day (QID) | INTRAMUSCULAR | Status: DC | PRN
  Administered 2015-04-17: 2.5 mg via INTRAVENOUS
  Administered 2015-04-17 (×2): 2 mg via INTRAVENOUS
  Filled 2015-04-17 (×2): qty 1

## 2015-04-17 MED ORDER — MORPHINE SULFATE (PF) 2 MG/ML IV SOLN
2.0000 mg | INTRAVENOUS | Status: DC | PRN
  Administered 2015-04-17: 2 mg via INTRAVENOUS
  Filled 2015-04-17: qty 1

## 2015-04-17 MED ORDER — DILTIAZEM HCL 100 MG IV SOLR
5.0000 mg/h | INTRAVENOUS | Status: DC
  Administered 2015-04-17: 5 mg/h via INTRAVENOUS
  Filled 2015-04-17 (×2): qty 100

## 2015-04-17 MED ORDER — PANTOPRAZOLE SODIUM 40 MG IV SOLR
40.0000 mg | INTRAVENOUS | Status: DC
  Administered 2015-04-17: 40 mg via INTRAVENOUS
  Filled 2015-04-17: qty 40

## 2015-04-17 MED ORDER — INSULIN ASPART 100 UNIT/ML ~~LOC~~ SOLN: 0.0000 [IU] | SUBCUTANEOUS | Status: DC

## 2015-04-17 MED ORDER — SODIUM CHLORIDE 0.9 % IV BOLUS (SEPSIS)
500.0000 mL | Freq: Once | INTRAVENOUS | Status: AC
  Administered 2015-04-17: 500 mL via INTRAVENOUS

## 2015-04-17 MED ORDER — MORPHINE SULFATE (PF) 2 MG/ML IV SOLN
2.0000 mg | INTRAVENOUS | Status: DC | PRN
  Administered 2015-04-17 – 2015-04-19 (×5): 2 mg via INTRAVENOUS
  Filled 2015-04-17 (×5): qty 1

## 2015-04-17 MED ORDER — INSULIN ASPART 100 UNIT/ML ~~LOC~~ SOLN: 0.0000 [IU] | Freq: Four times a day (QID) | SUBCUTANEOUS | Status: DC

## 2015-04-17 MED ORDER — FENTANYL CITRATE (PF) 100 MCG/2ML IJ SOLN
25.0000 ug | INTRAMUSCULAR | Status: DC | PRN
  Administered 2015-04-17: 25 ug via INTRAVENOUS
  Filled 2015-04-17: qty 2

## 2015-04-17 MED ORDER — GLYCOPYRROLATE 0.2 MG/ML IJ SOLN
0.2000 mg | Freq: Four times a day (QID) | INTRAMUSCULAR | Status: DC | PRN
  Administered 2015-04-18 – 2015-04-19 (×5): 0.2 mg via INTRAVENOUS
  Filled 2015-04-17 (×5): qty 1

## 2015-04-17 NOTE — Consult Note (Signed)
PULMONARY / CRITICAL CARE MEDICINE   Name: Briana Shaffer MRN: [id number] DOB: 1919-05-02    ADMISSION DATE:  04/16/2015   CONSULTATION DATE:  04/16/2015  REFERRING MD: Dr Allena Katz  CHIEF COMPLAINT:  Increased lethargy and hypoxia  HISTORY OF PRESENT ILLNESS:   This is a 82 yo female currently residing in a [facility],  PMH of Alzheimer's dementia, hypertension and hyperlipidemia who presented to the ED via EMS with decreased consciousness, fever and hypoxia with O2 saturation of 80% on room air. At the ED, patient was hypotensive and  Tachycardic. Chest x-ray showed left lower lobe infiltrate and labs showed an elevated troponin of 6. She was admitted with sepsis and seen by cardiology and deemed not a candidate for any invasive cardiac procedure. CCM was consulted for further management.  She is restless, and agitated. Blood pressures are still running in the 80s.   Patient is a DNR. Based on admitting MD's notes, patient will be made comfort care by family if no improvement in 24-36 hrs.   PAST MEDICAL HISTORY :  She  has a past medical history of Diabetes mellitus without complication (HCC); Hypertension; and Hyperlipemia.  PAST SURGICAL HISTORY: She  has no past surgical history on file.  No Known Allergies  No current facility-administered medications on file prior to encounter.   Current Outpatient Prescriptions on File Prior to Encounter  Medication Sig  . acetaminophen (TYLENOL) 325 MG tablet Take 650 mg by mouth every 6 (six) hours as needed for mild pain or moderate pain. Routine pain less than 4.  . lamoTRIgine (LAMICTAL) 25 MG tablet Take 50 mg by mouth 2 (two) times daily.   . QUEtiapine (SEROQUEL) 50 MG tablet Take 50 mg by mouth 2 (two) times daily. *note dose*  . traMADol (ULTRAM) 50 MG tablet Take 50 mg by mouth every 12 (twelve) hours as needed for moderate pain or severe pain. Take when pain is greater than 4 but less than 8.    FAMILY HISTORY:  Her has no family status  information on file.   SOCIAL HISTORY: She  reports that she has never smoked. She has never used smokeless tobacco. She reports that she does not drink alcohol.  REVIEW OF SYSTEMS:   Unable to obtain-Patient is confused.   SUBJECTIVE:   VITAL SIGNS: BP 86/48 mmHg  Pulse 99  Temp(Src) 99.7 F (37.6 C)  Resp 19  Ht 5' (1.524 m)  Wt 48.3 kg (106 lb 7.7 oz)  BMI 20.80 kg/m2  SpO2 99%  HEMODYNAMICS:    VENTILATOR SETTINGS: Vent Mode:  [-]  FiO2 (%):  [100 %] 100 %  INTAKE / OUTPUT: I/O last 3 completed shifts: In: 4394.2 [I.V.:4304.2; Other:40; IV Piggyback:50] Out: 425 [Urine:425]  PHYSICAL EXAMINATION: General: Acutely ill-looking, restless Neuro: Not following commands, moaning and groaning; moves all extremities HEENT: Oral cavity dry, PERRLA Cardiovascular: Tachycardic, S1/S2, no MRG Lungs: Tachypic with increased WOB, expiratory wheezes and rhonchi Abdomen:  Soft, NT/ND, normal bowel sounds Musculoskeletal:  +ROM Skin:  Warm  LABS:  BMET  Recent Labs Lab 04/16/15 1359 04/17/15 0412  NA 141 142  K 3.9 3.8  CL 102 110  CO2 32 25  BUN 43* 44*  CREATININE 2.19* 1.87*  GLUCOSE 208* 102*    Electrolytes  Recent Labs Lab 04/16/15 1359 04/17/15 0412  CALCIUM 7.7* 7.1*    CBC  Recent Labs Lab 04/16/15 1359 04/17/15 0412  WBC 6.8 4.9  HGB 9.0* 8.1*  HCT 27.3* 24.7*  PLT  219 209    Coag's  Recent Labs Lab 04/16/15 1454  APTT 44*    Sepsis Markers  Recent Labs Lab 04/16/15 1359 04/16/15 1628  LATICACIDVEN 2.3* 2.2*    ABG No results for input(s): PHART, PCO2ART, PO2ART in the last 168 hours.  Liver Enzymes  Recent Labs Lab 04/16/15 1359  AST 47*  ALT 13*  ALKPHOS 113  BILITOT 1.2  ALBUMIN 2.5*    Cardiac Enzymes  Recent Labs Lab 04/16/15 1628 04/16/15 2255 04/17/15 0412  TROPONINI 3.97* 3.64* 3.22*    Glucose No results for input(s): GLUCAP in the last 168 hours.  Imaging Dg Chest Port 1  View  04/16/2015  CLINICAL DATA:  Probable sepsis, known UTI, pt combative and unresponsive to questions or commands. EXAM: PORTABLE CHEST 1 VIEW COMPARISON:  01/28/2015 FINDINGS: Heart is enlarged. There has been development of significant opacity at the left lung base. There is pulmonary vascular congestion. Probable mild interstitial edema. Aorta is densely calcified. IMPRESSION: Left lower lobe infiltrate.  Cardiomegaly. Electronically Signed   By: Norva Pavlov M.D.   On: 04/16/2015 14:53     STUDIES:    CULTURES: Blood 02/19 Urine 02/19 Sputum-expectorated 02/19 Influenza 02/19>Negative  ANTIBIOTICS: Vancomycin 02/19 Zosyn 02/19  SIGNIFICANT EVENTS: 02/19>AMS and fever>ED>Admitted with Urosepsis and Left lower lobe pneumonia  LINES/TUBES: PIVs  DISCUSSION: 82 yo frail female presenting with sepsis, septic shock and left lower lobe pneumonia. Goal of care is to optimize medical management and preserve patient's comfort.   ASSESSMENT / PLAN:  PULMONARY A: Acute hypoxic respiratory failure Left lower lobe pneumonia P:   -Empiric antibiotics -Supplemental O2 by [place] or mask to keep O2 saturation >90% -Bronchodilator -Oral care per protocol -Aspiration precautions -Do not intubate -PRN morphine for dyspnea  CARDIOVASCULAR A:  Septic shock Elevated troponin P:  -Fid resuscitation -No pressors -Rectal ASA  RENAL A:   AKI; baseline creatinine 1.19 P:   -IV hydration -Renally dose meds -Avoid nephrotoxic drugs -f/u BMP  GASTROINTESTINAL A:   Adult failure to thrive P:   -Keep NPO until mental status improves -PPI for GI prophylaxis  HEMATOLOGIC A:   Anemia-chronic P:  -Monitor H/H -Transfuse prn -Heparin for DVT prophylaxis  INFECTIOUS A:   Sepsis of respiratory source P:   -Empiric antibiotics -F/U cultures  ENDOCRINE A:   Type 2 DM P:   -Blood glucose monitoring and SSI  NEUROLOGIC A:   Acute encephalopathy secondary to  sepsis Alzheimer's disease Severe agitation P:   RASS goal: n/a -Haldol prn for agitation    FAMILY  - Updates: No family at bedside during 02/20 rounds.   - Inter-disciplinary family meet or Palliative Care meeting due by:  02/30  Best Practice: Code Status:  DNR Diet: NPO GI prophylaxis:  PPI. VTE prophylaxis:  SCD's / heparin.  Magdalene S. Tukov ANP-BC Pulmonary and Critical Care Medicine [facility] Pager [phone]   PCCM ATTENDING ATTESTATION: I have evaluated patient with ANP Luci Bank, reviewed database in its entirety and discussed care plan in detail. In addition, this patient was discussed on multidisciplinary rounds.   Important exam findings: Very agitated/tachypneic Coarse BS L>R Tachycardic, IRIR Bruising on LLE No edema  Major problems addressed by PCCM team: Severe sepsis Urinary tract infection LLL opacity c/w PNA New onset AFRVR Elevated troponin I - not a candidate for invasive intervention AKI Severe agitated delirium Advanced age - DNR appropriate   PLAN/REC: Cont abx as ordered Agree with DNR/DNI. Would not add vasopressors Low dose  haloperidol for agitation Low dose morphine for dyspnea I updated family @ bedside   Billy Fischer, MD PCCM service Mobile [phone] Pager [phone]   04/17/2015, 2:29 PM

## 2015-04-17 NOTE — H&P (Signed)
PULMONARY / CRITICAL CARE MEDICINE   Name: Briana Shaffer MRN: [id number] DOB: 1919/05/30    ADMISSION DATE:  04/16/2015   CONSULTATION DATE:  04/16/2015  REFERRING MD: Dr Allena Katz  CHIEF COMPLAINT:  Increased lethargy and hypoxia  HISTORY OF PRESENT ILLNESS:   This is a 82 yo female currently residing in a [facility],  PMH of Alzheimer's dementia, hypertension and hyperlipidemia who presented to the ED via EMS with decreased consciousness, fever and hypoxia with O2 saturation of 80% on room air. At the ED, patient was hypotensive and  Tachycardic. Chest x-ray showed left lower lobe infiltrate and labs showed an elevated troponin of 6. She was admitted with sepsis and seen by cardiology and deemed not a candidate for any invasive cardiac procedure. CCM was consulted for further management.  She is restless, and agitated. Blood pressures are still running in the 80s.   Patient is a DNR. Based on admitting MD's notes, patient will be made comfort care by family if no improvement in 24-36 hrs.   PAST MEDICAL HISTORY :  She  has a past medical history of Diabetes mellitus without complication (HCC); Hypertension; and Hyperlipemia.  PAST SURGICAL HISTORY: She  has no past surgical history on file.  No Known Allergies  No current facility-administered medications on file prior to encounter.   Current Outpatient Prescriptions on File Prior to Encounter  Medication Sig  . acetaminophen (TYLENOL) 325 MG tablet Take 650 mg by mouth every 6 (six) hours as needed for mild pain or moderate pain. Routine pain less than 4.  . lamoTRIgine (LAMICTAL) 25 MG tablet Take 50 mg by mouth 2 (two) times daily.   . QUEtiapine (SEROQUEL) 50 MG tablet Take 50 mg by mouth 2 (two) times daily. *note dose*  . traMADol (ULTRAM) 50 MG tablet Take 50 mg by mouth every 12 (twelve) hours as needed for moderate pain or severe pain. Take when pain is greater than 4 but less than 8.    FAMILY HISTORY:  Her has no family status  information on file.   SOCIAL HISTORY: She  reports that she has never smoked. She has never used smokeless tobacco. She reports that she does not drink alcohol.  REVIEW OF SYSTEMS:   Unable to obtain-Patient is confused.   SUBJECTIVE:   VITAL SIGNS: BP 102/88 mmHg  Pulse 123  Temp(Src) 99.9 F (37.7 C)  Resp 27  Ht 5' (1.524 m)  Wt 106 lb 7.7 oz (48.3 kg)  BMI 20.80 kg/m2  SpO2 99%  HEMODYNAMICS:    VENTILATOR SETTINGS: Vent Mode:  [-]  FiO2 (%):  [100 %] 100 %  INTAKE / OUTPUT: I/O last 3 completed shifts: In: 4394.2 [I.V.:4304.2; Other:40; IV Piggyback:50] Out: 425 [Urine:425]  PHYSICAL EXAMINATION: General: Acutely ill-looking, restless Neuro: Not following commands, moaning and groaning; moves all extremities HEENT: Oral cavity dry, PERRLA Cardiovascular: Tachycardic, S1/S2, no MRG Lungs: Tachypic with increased WOB, expiratory wheezes and rhonchi Abdomen:  Soft, NT/ND, normal bowel sounds Musculoskeletal:  +ROM Skin:  Warm  LABS:  BMET  Recent Labs Lab 04/16/15 1359 04/17/15 0412  NA 141 142  K 3.9 3.8  CL 102 110  CO2 32 25  BUN 43* 44*  CREATININE 2.19* 1.87*  GLUCOSE 208* 102*    Electrolytes  Recent Labs Lab 04/16/15 1359 04/17/15 0412  CALCIUM 7.7* 7.1*    CBC  Recent Labs Lab 04/16/15 1359 04/17/15 0412  WBC 6.8 4.9  HGB 9.0* 8.1*  HCT 27.3* 24.7*  PLT  219 209    Coag's  Recent Labs Lab 04/16/15 1454  APTT 44*    Sepsis Markers  Recent Labs Lab 04/16/15 1359 04/16/15 1628  LATICACIDVEN 2.3* 2.2*    ABG No results for input(s): PHART, PCO2ART, PO2ART in the last 168 hours.  Liver Enzymes  Recent Labs Lab 04/16/15 1359  AST 47*  ALT 13*  ALKPHOS 113  BILITOT 1.2  ALBUMIN 2.5*    Cardiac Enzymes  Recent Labs Lab 04/16/15 1628 04/16/15 2255 04/17/15 0412  TROPONINI 3.97* 3.64* 3.22*    Glucose No results for input(s): GLUCAP in the last 168 hours.  Imaging Dg Chest Port 1  View  04/16/2015  CLINICAL DATA:  Probable sepsis, known UTI, pt combative and unresponsive to questions or commands. EXAM: PORTABLE CHEST 1 VIEW COMPARISON:  01/28/2015 FINDINGS: Heart is enlarged. There has been development of significant opacity at the left lung base. There is pulmonary vascular congestion. Probable mild interstitial edema. Aorta is densely calcified. IMPRESSION: Left lower lobe infiltrate.  Cardiomegaly. Electronically Signed   By: Norva Pavlov M.D.   On: 04/16/2015 14:53     STUDIES:    CULTURES: Blood 02/19 Urine 02/19 Sputum-expectorated 02/19 Influenza 02/19>Negative  ANTIBIOTICS: Vancomycin 02/19 Zosyn 02/19  SIGNIFICANT EVENTS: 02/19>AMS and fever>ED>Admitted with Urosepsis and Left lower lobe pneumonia  LINES/TUBES: PIVs  DISCUSSION: 82 yo frail female presenting with sepsis, septic shock and left lower lobe pneumonia. Goal of care is to optimize medical management and preserve patient's comfort.   ASSESSMENT / PLAN:  PULMONARY A: Acute hypoxic respiratory failure Left lower lobe pneumonia P:   -Empiric antibiotics -Supplemental O2 by [place] or mask to keep O2 saturation >90% -Bronchodilator -Oral care per protocol -Aspiration precautions -Do not intubate -PRN morphine for dyspnea  CARDIOVASCULAR A:  Septic shock Elevated troponin P:  -Fid resuscitation -No pressors -Rectal ASA  RENAL A:   AKI; baseline creatinine 1.19 P:   -IV hydration -Renally dose meds -Avoid nephrotoxic drugs -f/u BMP  GASTROINTESTINAL A:   Adult failure to thrive P:   -Keep NPO until mental status improves -PPI for GI prophylaxis  HEMATOLOGIC A:   Anemia-chronic P:  -Monitor H/H -Transfuse prn -Heparin for DVT prophylaxis  INFECTIOUS A:   Sepsis of respiratory source P:   -Empiric antibiotics -F/U cultures  ENDOCRINE A:   Type 2 DM P:   -Blood glucose monitoring and SSI  NEUROLOGIC A:   Acute encephalopathy secondary to  sepsis Alzheimer's disease Severe agitation P:   RASS goal: n/a -Haldol prn for agitation    FAMILY  - Updates: No family at bedside during 02/20 rounds.   - Inter-disciplinary family meet or Palliative Care meeting due by:  02/30  Best Practice: Code Status:  DNR Diet: NPO GI prophylaxis:  PPI. VTE prophylaxis:  SCD's / heparin.  CCM time is 35 minutes  Magdalene S. [facility] ANP-BC Pulmonary and Critical Care Medicine [facility] Pager [phone]   04/17/2015, 9:04 AM  This note was inadvertently labeled as an H&P. I copied it and signed it as a consult note. I am unable to delete this duplicate  Billy Fischer, MD PCCM service Mobile [phone] Pager [phone]

## 2015-04-17 NOTE — Progress Notes (Signed)
[facility] - [place] at [facility]   PATIENT NAME: Briana Shaffer    MR#:  [id number]  DATE OF BIRTH:  06/06/1919  SUBJECTIVE:  Demented. Not communicative.   REVIEW OF SYSTEMS:   Review of Systems  Unable to perform ROS: dementia   Tolerating Diet:npo Tolerating PT: not sure if she will participate  DRUG ALLERGIES:  No Known Allergies  VITALS:  Blood pressure 112/58, pulse 97, temperature 99.5 F (37.5 C), resp. rate 21, height 5' (1.524 m), weight 48.3 kg (106 lb 7.7 oz), SpO2 100 %.  PHYSICAL EXAMINATION:   Physical Exam  GENERAL:  82 y.o.-year-old patient lying in the bed with no acute distress. Critically ill EYES: Pupils equal, round, reactive to light and accommodation. No scleral icterus. Extraocular muscles intact.  HEENT: Head atraumatic, normocephalic. Oropharynx and nasopharynx clear. Dry mucosa NECK:  Supple, no jugular venous distention. No thyroid enlargement, no tenderness.  LUNGS: coarse breath sounds bilaterally, no wheezing, rales, rhonchi. No use of accessory muscles of respiration.  CARDIOVASCULAR: S1, S2 normal. No murmurs, rubs, or gallops.  ABDOMEN: Soft, nontender, nondistended. Bowel sounds present. No organomegaly or mass. Foley urine clearing up EXTREMITIES: No cyanosis, clubbing or edema b/l.    NEUROLOGIC: unable to assess due to lethargy and dementia PSYCHIATRIC:  lethargic SKIN: purplish discoloration of skin over the coccyx LABORATORY PANEL:  CBC  Recent Labs Lab 04/17/15 0412  WBC 4.9  HGB 8.1*  HCT 24.7*  PLT 209    Chemistries   Recent Labs Lab 04/16/15 1359 04/17/15 0412  NA 141 142  K 3.9 3.8  CL 102 110  CO2 32 25  GLUCOSE 208* 102*  BUN 43* 44*  CREATININE 2.19* 1.87*  CALCIUM 7.7* 7.1*  AST 47*  --   ALT 13*  --   ALKPHOS 113  --   BILITOT 1.2  --    Cardiac Enzymes  Recent Labs Lab 04/17/15 0412  TROPONINI 3.22*   RADIOLOGY:  Dg Chest Port 1 View  04/16/2015  CLINICAL DATA:   Probable sepsis, known UTI, pt combative and unresponsive to questions or commands. EXAM: PORTABLE CHEST 1 VIEW COMPARISON:  01/28/2015 FINDINGS: Heart is enlarged. There has been development of significant opacity at the left lung base. There is pulmonary vascular congestion. Probable mild interstitial edema. Aorta is densely calcified. IMPRESSION: Left lower lobe infiltrate.  Cardiomegaly. Electronically Signed   By: Norva Pavlov M.D.   On: 04/16/2015 14:53   ASSESSMENT AND PLAN:   Briana Shaffer is a 82 y.o. female with a known history of dementia, hypertension, hyperlipidemia comes to the emergency room from [facility] with altered mental status lethargic fever and hypoxia.  Patient was found to be septic with left lower lobe pneumonia and severe complicated UTI  1. severe sepsis secondary to left lower lobe pneumonia and severe UTI with acute renal failure -Patient is a no code DO NOT RESUSCITATE this was discussed with extended family present in the emergency room -IV vancomycin and Zosyn -IV fluids for hydration -Vasopressors if needed -Follow blood cultures urine culture and sputum culture -Pulmonary consultation -As needed breathing treatments  2. Acute non-Q-wave MI in the setting of severe sepsis -Family does not want aggressive treatment -They understand patient is critically ill and may not make this hospitalization -We will hold off on IV heparin drip. Case was discussed with Dr. Juliann Pares given critical illness patient not a candidate for any aggressive intervention at this time -cont aspirin per rectally -Due to  sepsis and low blood pressure unable to give beta blockers  3. Acute renal failure appears ATN in the setting of severe sepsis -IV fluids for hydration -Monitor I's and O's, avoid nephrotoxins, consider nephrology consultation if needed -creat 2.19--1.81 -uop 425 cc   4. Severe dementia  5. Acidosis due to severe sepsis -Treatment as above  6. DVT  prophylaxis subcutaneous heparin  Case discussed with Care Management/Social Worker. Management plans discussed with the patient, family and they are in agreement.  CODE STATUS: DNR  DVT Prophylaxis: heparin  TOTAL critical TIME TAKING CARE OF THIS PATIENT: 35 minutes.  >50% time spent on counselling and coordination of care   D/C DEPENDING ON CLINICAL CONDITION.  Note: This dictation was prepared with Dragon dictation along with smaller phrase technology. Any transcriptional errors that result from this process are unintentional.  Tavia Stave M.D on 04/17/2015 at 7:59 AM  Between 7am to 6pm - Pager - [phone]  After 6pm go to [email or web address] - password [email or web address] [facility]  [facility]  Office  [phone]  CC: Primary care physician; Derwood Kaplan, MD

## 2015-04-17 NOTE — Progress Notes (Signed)
eLink Physician-Brief Progress Note Patient Name: Briana Shaffer DOB: January 29, 1920 MRN: [id number]   Date of Service  04/17/2015  HPI/Events of Note  Pt appears to be in pain.  Hypotensive and oliguric.   eICU Interventions  Will order prn fentanyl.  Will give 500 ml NS fluid bolus.      Intervention Category Major Interventions: Other:  Lorence Nagengast 04/17/2015, 1:56 AM

## 2015-04-17 NOTE — Progress Notes (Signed)
Elink informed of am labs: Ca-7.1, and hemoglobin-8.1.

## 2015-04-17 NOTE — Consult Note (Signed)
[facility] CLINIC CARDIOLOGY [facility]  CARDIOLOGY CONSULT NOTE  Patient ID: Briana Shaffer MRN: [id number] DOB/AGE: 1919/04/19 82 y.o.  Admit date: 04/16/2015 Referring Physician DSr. patel Primary Physician   Primary Cardiologist   Reason for Consultation afib with rvr and elevated troponin  HPI: Pt is a 82 yo female with hsitory of alzheimers dementia, hypertension and hyprelipidemia who was admitted after presenting to the er with decreased level of mentation, fever and hypoxia with oxygen sat of 80 on ra. She was noted to be hypotensive and tachyardic with ekg showeing afib with rvr. CXR showed lll infiltrate and she had a troponin of 6. She was not felt to be a cnadidate for emergent invasive intervention. She is a very difficult historina. She has intermitant nsr with afib with rvr.   Review of Systems  Constitutional: Positive for fever and chills.  HENT: Negative.   Eyes: Negative.   Respiratory: Positive for shortness of breath.   Cardiovascular: Negative.   Gastrointestinal: Negative.   Genitourinary: Negative.   Musculoskeletal: Negative.   Neurological: Positive for weakness.  Endo/Heme/Allergies: Negative.   Psychiatric/Behavioral: Negative.     Past Medical History  Diagnosis Date  . Diabetes mellitus without complication (HCC)   . Hypertension   . Hyperlipemia     No family history on file.  Social History   Social History  . Marital Status: Widowed    Spouse Name: N/A  . Number of Children: N/A  . Years of Education: N/A   Occupational History  . Not on file.   Social History Main Topics  . Smoking status: Never Smoker   . Smokeless tobacco: Never Used  . Alcohol Use: No  . Drug Use: Not on file  . Sexual Activity: Not on file   Other Topics Concern  . Not on file   Social History Narrative    No past surgical history on file.   Prescriptions prior to admission  Medication Sig Dispense Refill Last Dose  . acetaminophen (TYLENOL)  325 MG tablet Take 650 mg by mouth every 6 (six) hours as needed for mild pain or moderate pain. Routine pain less than 4.   unknown  . acetaminophen (TYLENOL) 500 MG tablet Take 500 mg by mouth every 6 (six) hours as needed for fever. *Take for fever over 101*   unknown  . guaiFENesin-dextromethorphan (ROBITUSSIN DM) 100-10 MG/5ML syrup Take 10 mLs by mouth every 6 (six) hours as needed for cough.   unknown  . HYDROcodone-acetaminophen (NORCO/VICODIN) 5-325 MG tablet Take 1 tablet by mouth See admin instructions. Take 1 tablet by mouth every 6 hours for pain for right arm fracture and take 1 tablet by mouth every 6 hours as needed for pain.   unknown  . ipratropium-albuterol (DUONEB) 0.5-2.5 (3) MG/3ML SOLN Take 3 mLs by nebulization every 4 (four) hours as needed (for congestion.).   unknown  . lamoTRIgine (LAMICTAL) 25 MG tablet Take 50 mg by mouth 2 (two) times daily.    unknown  . omeprazole (PRILOSEC OTC) 20 MG tablet Take 20 mg by mouth daily.   unknown  . oseltamivir (TAMIFLU) 75 MG capsule Take 75 mg by mouth daily.   unknown  . QUEtiapine (SEROQUEL) 100 MG tablet Take 100 mg by mouth daily. *note dose*   unknown  . QUEtiapine (SEROQUEL) 50 MG tablet Take 50 mg by mouth 2 (two) times daily. *note dose*   unknown  . traMADol (ULTRAM) 50 MG tablet Take 50 mg by mouth  every 12 (twelve) hours as needed for moderate pain or severe pain. Take when pain is greater than 4 but less than 8.   unknown    Physical Exam: Blood pressure 105/56, pulse 112, temperature 100.6 F (38.1 C), resp. rate 24, height 5' (1.524 m), weight 48.3 kg (106 lb 7.7 oz), SpO2 100 %.   Wt Readings from Last 1 Encounters:  04/16/15 48.3 kg (106 lb 7.7 oz)     General appearance: slowed mentation Head: Normocephalic, without obvious abnormality, atraumatic Resp: diminished breath sounds bilaterally Cardio: irregularly irregular rhythm GI: soft, non-tender; bowel sounds normal; no masses,  no organomegaly Extremities:  extremities normal, atraumatic, no cyanosis or edema Neurologic: Mental status: alertness: lethargic  Labs:   Lab Results  Component Value Date   WBC 4.9 04/17/2015   HGB 8.1* 04/17/2015   HCT 24.7* 04/17/2015   MCV 89.6 04/17/2015   PLT 209 04/17/2015    Recent Labs Lab 04/16/15 1359 04/17/15 0412  NA 141 142  K 3.9 3.8  CL 102 110  CO2 32 25  BUN 43* 44*  CREATININE 2.19* 1.87*  CALCIUM 7.7* 7.1*  PROT 5.7*  --   BILITOT 1.2  --   ALKPHOS 113  --   ALT 13*  --   AST 47*  --   GLUCOSE 208* 102*   Lab Results  Component Value Date   CKTOTAL 51 01/09/2015   TROPONINI 3.22* 04/17/2015      Radiology: lll infilttate EKG: afib with rvr  ASSESSMENT AND PLAN:  82 yo female with underlying dementia with worsened mental status. Note to have lll pna. Also has afib with rvr which is aparently new. Elevated troponin is likely demand ischemia in the face of probable reduce blood flow to heart. Not a candidate for cardiac cath. Will attempt t control trate with cardizem. Continue supportive care. Poor prognosis. Contineu heparin for now and empertic abx.  Signed: Dalia Heading MD, [facility] 04/17/2015, 9:30 PM

## 2015-04-17 NOTE — Care Management (Signed)
Patient is from [facility].  She is admitted with MI and septic shock from pneumonia and uti..  There is discussion with family  members in regards to not pursuing aggressive treatment. Threre is concern that patient will not survive this hospitalization

## 2015-04-17 NOTE — Progress Notes (Addendum)
ANTIBIOTIC CONSULT NOTE - FOLLOW UP Day 2  Pharmacy Consult for Vancomycin , Zosyn  Indication: sepsis  No Known Allergies  Patient Measurements: Height: 5' (152.4 cm) Weight: 106 lb 7.7 oz (48.3 kg) IBW/kg (Calculated) : 45.5 Adjusted Body Weight: 46.6 kg   Vital Signs: Temp: 99.9 F (37.7 C) (02/20 0900) BP: 102/88 mmHg (02/20 0900) Pulse Rate: 123 (02/20 0900) Intake/Output from previous day: 02/19 0701 - 02/20 0700 In: 4394.2 [I.V.:4304.2; IV Piggyback:50] Out: 425 [Urine:425] Intake/Output from this shift:    Labs:  Recent Labs  04/16/15 1359 04/17/15 0412  WBC 6.8 4.9  HGB 9.0* 8.1*  PLT 219 209  CREATININE 2.19* 1.87*   Estimated Creatinine Clearance: 13.2 mL/min (by C-G formula based on Cr of 1.87). No results for input(s): VANCOTROUGH, VANCOPEAK, VANCORANDOM, GENTTROUGH, GENTPEAK, GENTRANDOM, TOBRATROUGH, TOBRAPEAK, TOBRARND, AMIKACINPEAK, AMIKACINTROU, AMIKACIN in the last 72 hours.   Microbiology: Recent Results (from the past 720 hour(s))  Culture, blood (routine x 2)     Status: None (Preliminary result)   Collection Time: 04/16/15  1:59 PM  Result Value Ref Range Status   Specimen Description BLOOD RIGHT ASSIST CONTROL  Final   Special Requests BOTTLES DRAWN AEROBIC AND ANAEROBIC 1CC  Final   Culture NO GROWTH < 24 HOURS  Final   Report Status PENDING  Incomplete  Culture, blood (routine x 2)     Status: None (Preliminary result)   Collection Time: 04/16/15  1:59 PM  Result Value Ref Range Status   Specimen Description BLOOD LEFT ARM  Final   Special Requests BOTTLES DRAWN AEROBIC AND ANAEROBIC 1 CC  Final   Culture NO GROWTH < 24 HOURS  Final   Report Status PENDING  Incomplete  Rapid Influenza A&B Antigens ([facility] only)     Status: None   Collection Time: 04/16/15  2:19 PM  Result Value Ref Range Status   Influenza A ([facility]) NEGATIVE  Final   Influenza B ([facility]) NEGATIVE  Final  Culture, expectorated sputum-assessment     Status: None   Collection Time: 04/16/15  4:24 PM  Result Value Ref Range Status   Specimen Description EXPECTORATED SPUTUM  Final   Special Requests Normal  Final   Sputum evaluation THIS SPECIMEN IS ACCEPTABLE FOR SPUTUM CULTURE  Final   Report Status 04/16/2015 FINAL  Final  MRSA PCR Screening     Status: None   Collection Time: 04/16/15  4:24 PM  Result Value Ref Range Status   MRSA by PCR NEGATIVE NEGATIVE Final    Comment:        The GeneXpert MRSA Assay (FDA approved for NASAL specimens only), is one component of a comprehensive MRSA colonization surveillance program. It is not intended to diagnose MRSA infection nor to guide or monitor treatment for MRSA infections.   Culture, respiratory (NON-Expectorated)     Status: None (Preliminary result)   Collection Time: 04/16/15  4:24 PM  Result Value Ref Range Status   Specimen Description EXPECTORATED SPUTUM  Final   Special Requests Normal Reflexed from [id number]  Final   Gram Stain PENDING  Incomplete   Culture HOLDING FOR POSSIBLE PATHOGEN  Final   Report Status PENDING  Incomplete    Medical History: Past Medical History  Diagnosis Date  . Diabetes mellitus without complication (HCC)   . Hypertension   . Hyperlipemia     Medications:  Prescriptions prior to admission  Medication Sig Dispense Refill Last Dose  . acetaminophen (TYLENOL) 325 MG tablet Take 650 mg by  mouth every 6 (six) hours as needed for mild pain or moderate pain. Routine pain less than 4.   unknown  . acetaminophen (TYLENOL) 500 MG tablet Take 500 mg by mouth every 6 (six) hours as needed for fever. *Take for fever over 101*   unknown  . guaiFENesin-dextromethorphan (ROBITUSSIN DM) 100-10 MG/5ML syrup Take 10 mLs by mouth every 6 (six) hours as needed for cough.   unknown  . HYDROcodone-acetaminophen (NORCO/VICODIN) 5-325 MG tablet Take 1 tablet by mouth See admin instructions. Take 1 tablet by mouth every 6 hours for pain for right arm fracture and take 1 tablet by  mouth every 6 hours as needed for pain.   unknown  . ipratropium-albuterol (DUONEB) 0.5-2.5 (3) MG/3ML SOLN Take 3 mLs by nebulization every 4 (four) hours as needed (for congestion.).   unknown  . lamoTRIgine (LAMICTAL) 25 MG tablet Take 50 mg by mouth 2 (two) times daily.    unknown  . omeprazole (PRILOSEC OTC) 20 MG tablet Take 20 mg by mouth daily.   unknown  . oseltamivir (TAMIFLU) 75 MG capsule Take 75 mg by mouth daily.   unknown  . QUEtiapine (SEROQUEL) 100 MG tablet Take 100 mg by mouth daily. *note dose*   unknown  . QUEtiapine (SEROQUEL) 50 MG tablet Take 50 mg by mouth 2 (two) times daily. *note dose*   unknown  . traMADol (ULTRAM) 50 MG tablet Take 50 mg by mouth every 12 (twelve) hours as needed for moderate pain or severe pain. Take when pain is greater than 4 but less than 8.   unknown   Assessment: CrCl = 11.3  Ke = 0.014 hr-1 T1/2 = 49.5 hrs Vd = 33.8 L    Goal of Therapy:  Vancomycin trough level 15-20 mcg/ml  Plan:  Expected duration 7 days with resolution of temperature and/or normalization of WBC  Continue Zosyn 3.375 g IV q12 hours.   Vancomycin 750 mg IV q48 hours ordered.Will continue current for now. Will order Vancomycin level to be drawn @ 1530 on 2/23. Level will not be @ [facility].     Raed Schalk D 04/17/2015,9:30 AM

## 2015-04-17 NOTE — Consult Note (Signed)
WOC wound consult note Reason for Consult:Deep Tissue Injury to coccyx Wound type:Deep tissue injury,  Pressure Ulcer POA: Yes Measurement: 3 cm x 2 cm maroon discoloration with surrounding nonblanchable erythema.   Wound bed:Intact maroon discoloration Drainage (amount, consistency, odor) None Periwound:Nonblanchable erythema Dressing procedure/placement/frequency:Allevyn silicone border sacral foam per protocol.  Turn and reposition every 2 hours.  Daughter is at bedside and made aware of this full thickness injury.  Will not follow at this time.  Please re-consult if needed.  Maple Hudson RN BSN CWON Pager [phone]

## 2015-04-17 NOTE — Progress Notes (Signed)
[facility] called at 0200 due to pt moaning and moving in bed, appeared to be in pain.  Also informed of pts lung sounds of wheezing and course crackles and pts poor output since 1900.  New orders placed for PRN fentanyl and NS blous.  [facility] called again at 0330 due to pt RR as well as moaning and continued moving in bed.  New orders placed for PRN morphine. Pt NT suctioned x3 during shift.  VSS and pt resting at present time.

## 2015-04-18 LAB — BASIC METABOLIC PANEL
Anion gap: 10 (ref 5–15)
BUN: 52 mg/dL — AB (ref 6–20)
CO2: 22 mmol/L (ref 22–32)
Calcium: 7.8 mg/dL — ABNORMAL LOW (ref 8.9–10.3)
Chloride: 113 mmol/L — ABNORMAL HIGH (ref 101–111)
Creatinine, Ser: 2.15 mg/dL — ABNORMAL HIGH (ref 0.44–1.00)
GFR calc Af Amer: 21 mL/min — ABNORMAL LOW (ref >60)
GFR calc non Af Amer: 19 mL/min — ABNORMAL LOW (ref >60)
Glucose, Bld: 113 mg/dL — ABNORMAL HIGH (ref 65–99)
POTASSIUM: 4 mmol/L (ref 3.5–5.1)
SODIUM: 145 mmol/L (ref 135–145)

## 2015-04-18 LAB — CBC
HCT: 26.8 % — ABNORMAL LOW (ref 35.0–47.0)
Hemoglobin: 8.6 g/dL — ABNORMAL LOW (ref 12.0–16.0)
MCH: 29 pg (ref 26.0–34.0)
MCHC: 32.1 g/dL (ref 32.0–36.0)
MCV: 90.2 fL (ref 80.0–100.0)
PLATELETS: 293 10*3/uL (ref 150–440)
RBC: 2.98 MIL/uL — AB (ref 3.80–5.20)
RDW: 15.6 % — ABNORMAL HIGH (ref 11.5–14.5)
WBC: 10.2 10*3/uL (ref 3.6–11.0)

## 2015-04-18 LAB — GLUCOSE, CAPILLARY
GLUCOSE-CAPILLARY: 110 mg/dL — AB (ref 65–99)
Glucose-Capillary: 90 mg/dL (ref 65–99)
Glucose-Capillary: 105 mg/dL — ABNORMAL HIGH (ref 65–99)

## 2015-04-18 MED ORDER — MORPHINE SULFATE (CONCENTRATE) 10 MG/0.5ML PO SOLN
10.0000 mg | ORAL | Status: DC | PRN
  Administered 2015-04-19: 10 mg via ORAL
  Filled 2015-04-18: qty 1

## 2015-04-18 NOTE — Clinical Social Work Note (Signed)
Informed by Clydie Braun with Hospice that patient is to go to hospice home tomorrow.  York Spaniel MSW,LCSW [phone]

## 2015-04-18 NOTE — Progress Notes (Signed)
[facility] - [place] [facility]   PATIENT NAME: Briana Shaffer    MR#:  [id number]  DATE OF BIRTH:  24-Jul-1919  SUBJECTIVE:  Demented. Not communicative. Looks much worse today now hypothermci.  REVIEW OF SYSTEMS:   Review of Systems  Unable to perform ROS: dementia   Tolerating Diet:npo Tolerating PT: not sure if she will participate  DRUG ALLERGIES:  No Known Allergies  VITALS:  Blood pressure 92/56, pulse 93, temperature 96.6 F (35.9 C), resp. rate 22, height 5' (1.524 m), weight 48.3 kg (106 lb 7.7 oz), SpO2 99 %.  PHYSICAL EXAMINATION:   Physical Exam  GENERAL:  82 y.o.-year-old patient lying in the bed with no acute distress. Critically ill EYES: Pupils equal, round, reactive to light and accommodation. No scleral icterus. Extraocular muscles intact.  HEENT: Head atraumatic, normocephalic. Oropharynx and nasopharynx clear. Dry mucosa NECK:  Supple, no jugular venous distention. No thyroid enlargement, no tenderness.  LUNGS: coarse breath sounds bilaterally, no wheezing, rales, rhonchi. No use of accessory muscles of respiration.  CARDIOVASCULAR: S1, S2 normal. No murmurs, rubs, or gallops.  ABDOMEN: Soft, nontender, nondistended. Bowel sounds present. No organomegaly or mass. Foley urine clearing up EXTREMITIES: No cyanosis, clubbing or edema b/l.    NEUROLOGIC: unable to assess due to lethargy and dementia PSYCHIATRIC:  lethargic SKIN: purplish discoloration of skin over the coccyx LABORATORY PANEL:  CBC  Recent Labs Lab 04/18/15 0421  WBC 10.2  HGB 8.6*  HCT 26.8*  PLT 293    Chemistries   Recent Labs Lab 04/16/15 1359  04/18/15 0421  NA 141  < > 145  K 3.9  < > 4.0  CL 102  < > 113*  CO2 32  < > 22  GLUCOSE 208*  < > 113*  BUN 43*  < > 52*  CREATININE 2.19*  < > 2.15*  CALCIUM 7.7*  < > 7.8*  AST 47*  --   --   ALT 13*  --   --   ALKPHOS 113  --   --   BILITOT 1.2  --   --   < > = values in this interval not  displayed. Cardiac Enzymes  Recent Labs Lab 04/17/15 0412  TROPONINI 3.22*   RADIOLOGY:  Dg Chest Port 1 View  04/16/2015  CLINICAL DATA:  Probable sepsis, known UTI, pt combative and unresponsive to questions or commands. EXAM: PORTABLE CHEST 1 VIEW COMPARISON:  01/28/2015 FINDINGS: Heart is enlarged. There has been development of significant opacity at the left lung base. There is pulmonary vascular congestion. Probable mild interstitial edema. Aorta is densely calcified. IMPRESSION: Left lower lobe infiltrate.  Cardiomegaly. Electronically Signed   By: Norva Pavlov M.D.   On: 04/16/2015 14:53   ASSESSMENT AND PLAN:   Cyrstal Leitz is a 82 y.o. female with a known history of dementia, hypertension, hyperlipidemia comes to the emergency room from [facility] with altered mental status lethargic fever and hypoxia.  Patient was found to be septic with left lower lobe pneumonia and severe complicated UTI  1. severe sepsis secondary to left lower lobe pneumonia and severe UTI with acute renal failure -Patient is a no code DO NOT RESUSCITATE this was discussed with extended family present in the emergency room -received IV vancomycin and Zosyn -IV fluids for hydration -blood cultures negative , GNR  urine culture and sputum culture positvie for staph aureus -Pulmonary consultation appreciated -As needed breathing treatments  2. Acute non-Q-wave MI in the  setting of severe sepsis -Family does not want aggressive treatment -They understand patient is critically ill and may not make this hospitalization -We will hold off on IV heparin drip. Case was discussed with Dr. Juliann Pares given critical illness patient not a candidate for any aggressive intervention at this time -cont aspirin per rectally -Due to sepsis and low blood pressure unable to give beta blockers  3. Acute renal failure appears ATN in the setting of severe sepsis -IV fluids for hydration -Monitor I's and O's, avoid  nephrotoxins, consider nephrology consultation if needed -creat 2.19--1.81--2.15 -uop 425 cc--200cc  4. Severe dementia  5. Acidosis due to severe sepsis -Treatment as above  6. DVT prophylaxis subcutaneous heparin  Discussed at length with patient's grandson Brayley Mackowiak and grandsons wife Ms. Clydie Braun and the head on [place] discussion with all the extended family members yesterday night and this morning. Family expresses that patient be made comfort measures since she has not shown much improvement and do not want any aggressive treatment at present. There are also in agreement with hospice facility. We'll discontinue IV fluids and IV antibiotics. When necessary morphine for comfort measures We'll transfer patient out of ICU. Case discussed with Care Management/Social Worker. Management plans discussed with the patient, family and they are in agreement.  CODE STATUS: DNR  DVT Prophylaxis: heparin  TOTAL critical TIME TAKING CARE OF THIS PATIENT: 35 minutes.  >50% time spent on counselling and coordination of care   D/C DEPENDING ON CLINICAL CONDITION.  Note: This dictation was prepared with Dragon dictation along with smaller phrase technology. Any transcriptional errors that result from this process are unintentional.  Krystina Strieter M.D on 04/18/2015 at 12:16 PM  Between 7am to 6pm - Pager - [phone]  After 6pm go to [email or web address] - password [email or web address] [facility]  Rafael Hernandez [facility]  Office  [phone]  CC: Primary care physician; Derwood Kaplan, MD

## 2015-04-18 NOTE — Clinical Social Work Note (Signed)
Clinical Social Work Assessment  Patient Details  Name: Briana Shaffer MRN: [id number] Date of Birth: [birth year]  Date of referral:  04/18/15               Reason for consult:  Facility Placement                Permission sought to share information with:  Family Supports Permission granted to share information::  Yes, Verbal Permission Granted  Name::     Tequia Wolman, grandson, [phone]  Housing/Transportation Living arrangements for the past 2 months:  Skilled Nursing Facility Source of Information:  Other (Comment Required) Patient Interpreter Needed:  None Criminal Activity/Legal Involvement Pertinent to Current Situation/Hospitalization:  No - Comment as needed Significant Relationships:  Adult Children, Other Family Members Lives with:  Facility Resident Do you feel safe going back to the place where you live?  Yes Need for family participation in patient care:  Yes (Comment)  Care giving concerns:  No care giving concerns identified.    Social Worker assessment / plan:  CSW spoke to pt's grandson to address consult. Pt was admitted from [facility], however was made comfort care during admission and transferred to 1C. CSW introduced herself and explained the process of discharging to [facility] for End of Life. Pt's family lives in [place] area and pt's family's preference is in that area. CSW explained the different places that Hospice services can be provided (home, at facility, and hospice home). CSW made referral to [facility], referral is under review. CSW also contacted [facility], however they are full.   Pt will be ready for discharge tomorrow. CSW updated MD. CSW will continue to follow.   Employment status:  Retired Health and safety inspector:  Armed forces operational officer, Medicaid In [place] PT Recommendations:  Not assessed at this time Information / Referral to community resources:  Other (Comment Required)  (Hospice)  Patient/Family's Response to care:  Pt's grandson was appreciative of CSW support.   Patient/Family's Understanding of and Emotional Response to Diagnosis, Current Treatment, and Prognosis:  Pt's family understands that pt's prognosis is poor and will likely need placement for hospice care.   Emotional Assessment Appearance:  Other (Comment Required Attitude/Demeanor/Rapport:  Other Affect (typically observed):  Other Orientation:  Fluctuating Orientation (Suspected and/or reported [place]) Alcohol / Substance use:  Never Used Psych involvement (Current and /or in the community):  No (Comment)  Discharge Needs  Concerns to be addressed:  Adjustment to Illness Readmission within the last 30 days:  No Current discharge risk:  Chronically ill Barriers to Discharge:  No Barriers Identified   Dede Query, LCSW 04/18/2015, 4:23 PM

## 2015-04-18 NOTE — Clinical Social Work Note (Signed)
This CSW informed by Clydie Braun with [facility] that family lives in [place] area and that they are now trying to decide between a hospice home in that area or possibly taking patient home as well. CSW on 1C is speaking with family to determine family wishes. York Spaniel MSW,LCSW [phone]

## 2015-04-18 NOTE — Progress Notes (Signed)
°   04/18/15 1400  Clinical Encounter Type  Visited With Family  Visit Type Initial  Referral From Nurse  Consult/Referral To Chaplain  Reviewed cremation documents with patient's family to assess requirements.   Chaplain [employer] Ext [phone]

## 2015-04-19 LAB — CULTURE, RESPIRATORY W GRAM STAIN

## 2015-04-19 LAB — CULTURE, RESPIRATORY: SPECIAL REQUESTS: NORMAL

## 2015-04-19 LAB — URINE CULTURE: Culture: >=100000

## 2015-04-19 MED ORDER — GLYCOPYRROLATE 0.2 MG/ML IJ SOLN: 0.2000 mg | Freq: Four times a day (QID) | INTRAMUSCULAR | Status: AC | PRN

## 2015-04-19 MED ORDER — MORPHINE SULFATE (CONCENTRATE) 10 MG/0.5ML PO SOLN: 10.0000 mg | ORAL | Status: AC | PRN

## 2015-04-19 NOTE — Progress Notes (Signed)
EMS called for transport. Guy Toney S, RN  

## 2015-04-19 NOTE — Care Management Important Message (Signed)
Important Message  Patient Details  Name: Briana Shaffer MRN: [id number] Date of Birth: 1919/11/30   Medicare Important Message Given:  Yes    Olegario Messier A [name] 04/19/2015, 10:44 AM

## 2015-04-19 NOTE — Progress Notes (Signed)
Report called to [facility]. Bo Mcclintock, RN

## 2015-04-19 NOTE — Discharge Summary (Signed)
[facility] - [place] [facility]   PATIENT NAME: Briana Shaffer    MR#:  [id number]  DATE OF BIRTH:  [birth year]  DATE OF ADMISSION:  04/16/2015 ADMITTING PHYSICIAN: Enedina Finner, MD  DATE OF DISCHARGE: 04/19/15 To hospice facility  PRIMARY CARE PHYSICIAN: Derwood Kaplan, MD    ADMISSION DIAGNOSIS:  Severe sepsis (HCC) [A41.9, R65.20] Hospital-acquired bacterial pneumonia [J15.9]  DISCHARGE DIAGNOSIS:  Severe sepsis Staph Aureus Pneumonia Severe complicated GNR UTI Acute NSTEMI Acute renal failure due to ATN in the setting of Sepsis  SECONDARY DIAGNOSIS:   Past Medical History  Diagnosis Date  . Diabetes mellitus without complication (HCC)   . Hypertension   . Hyperlipemia     HOSPITAL COURSE:  Briana Shaffer is a 82 y.o. female with a known history of dementia, hypertension, hyperlipidemia comes to the emergency room [facility] with altered mental status lethargic fever and hypoxia.  Patient was found to be septic with left lower lobe pneumonia and severe complicated UTI  1. severe sepsis secondary to left lower lobe pneumonia and severe UTI with acute renal failure -Patient is a no code DO NOT RESUSCITATE this was discussed with extended family  -received IV vancomycin and Zosyn -received aggressive IV fluids for hydration -blood cultures negative , GNR urine culture and sputum culture positvie for staph aureus -Pulmonary consultation appreciated  2. Acute non-Q-wave MI in the setting of severe sepsis -Family does not want aggressive treatment -They understand patient is critically ill and may not make this hospitalization -We will hold off on IV heparin drip. Case was discussed with Dr. Juliann Pares given critical illness patient not a candidate for any aggressive intervention at this time -received PR asa at admission -Due to sepsis and low blood pressure unable to give beta blockers  3. Acute renal failure appears ATN in the setting  of severe sepsis -IV fluids for hydration -Monitor I's and O's, avoid nephrotoxins, consider nephrology consultation if needed -creat 2.19--1.81--2.15 -uop 425 cc--200cc  4. Severe dementia  5. Acidosis due to severe sepsis -Treatment as above  Pt is now comfort measures only per family request and discussion. She will transition to Hospice facility in [place] today when bed available.  DNR D/w dter in the room today  CONSULTS OBTAINED:  Treatment Team:  Shane Crutch, MD Merwyn Katos, MD Dalia Heading, MD  DRUG ALLERGIES:  No Known Allergies  DISCHARGE MEDICATIONS:   Current Discharge Medication List    START taking these medications   Details  glycopyrrolate (ROBINUL) 0.2 MG/ML injection Inject 1 mL (0.2 mg total) into the vein 4 (four) times daily as needed (secretions). Qty: 1 mL, Refills: 1    Morphine Sulfate (MORPHINE CONCENTRATE) 10 MG/0.5ML SOLN concentrated solution Take 0.5 mLs (10 mg total) by mouth every 2 (two) hours as needed for severe pain (comfort measures). Qty: 42 mL, Refills: 0      CONTINUE these medications which have NOT CHANGED   Details  acetaminophen (TYLENOL) 325 MG tablet Take 650 mg by mouth every 6 (six) hours as needed for mild pain or moderate pain. Routine pain less than 4.      STOP taking these medications     guaiFENesin-dextromethorphan (ROBITUSSIN DM) 100-10 MG/5ML syrup      HYDROcodone-acetaminophen (NORCO/VICODIN) 5-325 MG tablet      ipratropium-albuterol (DUONEB) 0.5-2.5 (3) MG/3ML SOLN      lamoTRIgine (LAMICTAL) 25 MG tablet      omeprazole (PRILOSEC OTC) 20  MG tablet      oseltamivir (TAMIFLU) 75 MG capsule      QUEtiapine (SEROQUEL) 100 MG tablet      QUEtiapine (SEROQUEL) 50 MG tablet      traMADol (ULTRAM) 50 MG tablet         If you experience worsening of your admission symptoms, develop shortness of breath, life threatening emergency, suicidal or homicidal thoughts you must seek  medical attention immediately by calling 911 or calling your MD immediately  if symptoms less severe.  You Must read complete instructions/literature along with all the possible adverse reactions/side effects for all the Medicines you take and that have been prescribed to you. Take any new Medicines after you have completely understood and accept all the possible adverse reactions/side effects.   Please note  You were cared for by a hospitalist during your hospital stay. If you have any questions about your discharge medications or the care you received while you were in the hospital after you are discharged, you can call the unit and asked to speak with the hospitalist on call if the hospitalist that took care of you is not available. Once you are discharged, your primary care physician will handle any further medical issues. Please note that NO REFILLS for any discharge medications will be authorized once you are discharged, as it is imperative that you return to your primary care physician (or establish a relationship with a primary care physician if you do not have one) for your aftercare needs so that they can reassess your need for medications and monitor your lab values. Today   SUBJECTIVE   unresponsive   VITAL SIGNS:  Blood pressure 88/34, pulse 72, temperature 99.4 F (37.4 C), temperature source Oral, resp. rate 20, height 5' (1.524 m), weight 48.3 kg (106 lb 7.7 oz), SpO2 86 %.  I/O:  No intake or output data in the 24 hours ending 04/19/15 0714  PHYSICAL EXAMINATION:  GENERAL:  82 y.o.-year-old patient lying in the bed with no acute distress. Critically ill EYES: Pupils equal, round, reactive sluggis to light and accommodation. No scleral icterus. Extraocular muscles intact.  HEENT: Head atraumatic, normocephalic. Oropharynx and nasopharynx clear.  NECK:  Supple, no jugular venous distention. No thyroid enlargement, no tenderness.  LUNGS: coarse breath sounds bilaterally, no  wheezing, rales,rhonchi or crepitation. No use of accessory muscles of respiration.  CARDIOVASCULAR: S1, S2 normal. No murmurs, rubs, or gallops.  ABDOMEN: Soft, non-tender, non-distended. Feeble Bowel sounds present. No organomegaly or mass.  EXTREMITIES: No pedal edema, cyanosis, or clubbing.  NEUROLOGIC: unable to assess PSYCHIATRIC:unable to assess SKIN: No obvious rash, lesion, or ulcer.   DATA REVIEW:   CBC   Recent Labs Lab 04/18/15 0421  WBC 10.2  HGB 8.6*  HCT 26.8*  PLT 293    Chemistries   Recent Labs Lab 04/16/15 1359  04/18/15 0421  NA 141  < > 145  K 3.9  < > 4.0  CL 102  < > 113*  CO2 32  < > 22  GLUCOSE 208*  < > 113*  BUN 43*  < > 52*  CREATININE 2.19*  < > 2.15*  CALCIUM 7.7*  < > 7.8*  AST 47*  --   --   ALT 13*  --   --   ALKPHOS 113  --   --   BILITOT 1.2  --   --   < > = values in this interval not displayed.  Microbiology Results   Recent  Results (from the past 240 hour(s))  Culture, blood (routine x 2)     Status: None (Preliminary result)   Collection Time: 04/16/15  1:59 PM  Result Value Ref Range Status   Specimen Description BLOOD RIGHT ASSIST CONTROL  Final   Special Requests BOTTLES DRAWN AEROBIC AND ANAEROBIC 1CC  Final   Culture NO GROWTH 2 DAYS  Final   Report Status PENDING  Incomplete  Culture, blood (routine x 2)     Status: None (Preliminary result)   Collection Time: 04/16/15  1:59 PM  Result Value Ref Range Status   Specimen Description BLOOD LEFT ARM  Final   Special Requests BOTTLES DRAWN AEROBIC AND ANAEROBIC 1 CC  Final   Culture NO GROWTH 2 DAYS  Final   Report Status PENDING  Incomplete  Urine culture     Status: None (Preliminary result)   Collection Time: 04/16/15  2:19 PM  Result Value Ref Range Status   Specimen Description URINE, RANDOM  Final   Special Requests NONE  Final   Culture   Final    >=100,000 COLONIES/mL GRAM NEGATIVE RODS IDENTIFICATION AND SUSCEPTIBILITIES TO FOLLOW    Report Status  PENDING  Incomplete  Rapid Influenza A&B Antigens ([facility] only)     Status: None   Collection Time: 04/16/15  2:19 PM  Result Value Ref Range Status   Influenza A ([facility]) NEGATIVE  Final   Influenza B ([facility]) NEGATIVE  Final  Culture, expectorated sputum-assessment     Status: None   Collection Time: 04/16/15  4:24 PM  Result Value Ref Range Status   Specimen Description EXPECTORATED SPUTUM  Final   Special Requests Normal  Final   Sputum evaluation THIS SPECIMEN IS ACCEPTABLE FOR SPUTUM CULTURE  Final   Report Status 04/16/2015 FINAL  Final  MRSA PCR Screening     Status: None   Collection Time: 04/16/15  4:24 PM  Result Value Ref Range Status   MRSA by PCR NEGATIVE NEGATIVE Final    Comment:        The GeneXpert MRSA Assay (FDA approved for NASAL specimens only), is one component of a comprehensive MRSA colonization surveillance program. It is not intended to diagnose MRSA infection nor to guide or monitor treatment for MRSA infections.   Culture, respiratory (NON-Expectorated)     Status: None (Preliminary result)   Collection Time: 04/16/15  4:24 PM  Result Value Ref Range Status   Specimen Description EXPECTORATED SPUTUM  Final   Special Requests Normal Reflexed from [id number]  Final   Gram Stain   Final    MANY WBC SEEN MANY GRAM NEGATIVE RODS MANY GRAM POSITIVE COCCI RARE YEAST EXCELLENT SPECIMEN - 90-100% WBCS    Culture   Final    HEAVY GROWTH STAPHYLOCOCCUS AUREUS SUSCEPTIBILITIES TO FOLLOW    Report Status PENDING  Incomplete    RADIOLOGY:  No results found.   Management plans discussed with the patient, family and they are in agreement.  CODE STATUS:     Code Status Orders        Start     Ordered   04/16/15 1624  Do not attempt resuscitation (DNR)   Continuous    Question Answer Comment  In the event of cardiac or respiratory ARREST Do not call a "code blue"   In the event of cardiac or respiratory ARREST Do not perform Intubation, CPR,  defibrillation or ACLS   In the event of cardiac or respiratory ARREST Use medication by any route,  position, wound care, and other measures to relive pain and suffering. May use oxygen, suction and manual treatment of airway obstruction as needed for comfort.      04/16/15 1623    Code Status History    Date Active Date Inactive Code Status Order ID Comments User Context   This patient has a current code status but no historical code status.      TOTAL TIME TAKING CARE OF THIS PATIENT: .    Miyonna Ormiston M.D on 04/19/2015 at 7:14 AM  Between 7am to 6pm - Pager - [phone] After 6pm go to [email or web address] - password [email or web address] [facility]  [place] [facility]  Office  [phone]  CC: Primary care physician; Derwood Kaplan, MD

## 2015-04-19 NOTE — Clinical Social Work Note (Signed)
Pt is ready for discharge today. Pt has been accepted to [facility]. Pt's family are agreeable to discharge plan. Facility has received discharge information and is able to accept. RN will call report. [facility] will provide transportation. CSW is signing off as no further needs identified.   Dede Query, MSW, LCSW Clinical Social Worker  [phone]

## 2015-04-19 NOTE — Progress Notes (Signed)
Patient discharged to [facility] via EMS. Bo Mcclintock, RN

## 2015-04-21 LAB — CULTURE, BLOOD (ROUTINE X 2)
CULTURE: NO GROWTH
Culture: NO GROWTH

## 2015-04-26 DEATH — deceased

## 2017-11-09 IMAGING — CT CT ABD-PELV W/ CM
1 of 3 series · 13 of 32 positions shown, 17 images · IV contrast (omnipaque)
Comparison: CT the abdomen and pelvis 01/07/2008.

CLINICAL DATA: [AGE] female with unwitnessed fall today.
Hematuria.

EXAM:
CT ABDOMEN AND PELVIS WITH CONTRAST
TECHNIQUE: Multidetector CT imaging of the abdomen and pelvis was performed
using the standard protocol following bolus administration of
intravenous contrast.
CONTRAST:  75mL OMNIPAQUE IOHEXOL 300 MG/ML  SOLN

[Series 2: routine abd pel with · axial · 0.64mm/px · z∈[-401,-31]mm · 13 of 84 slices shown, 17 images]
[im 5/84  soft-tissue]
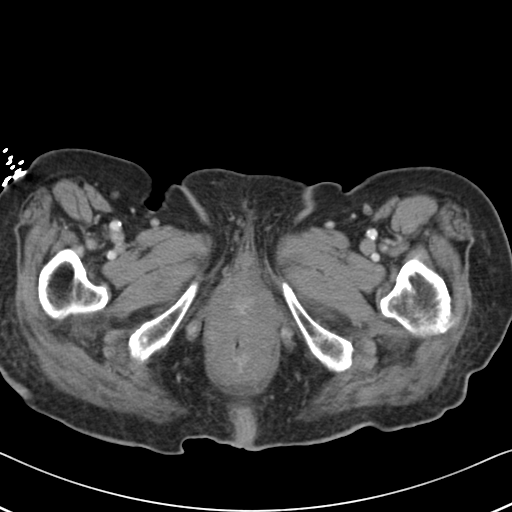
[im 5/84  bone]
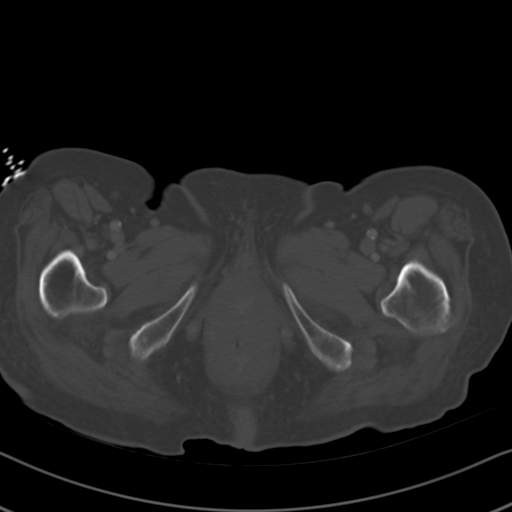
[im 13/84  soft-tissue]
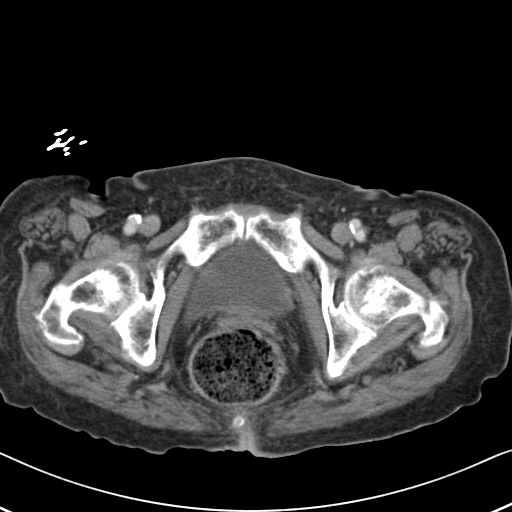
[im 21/84  soft-tissue]
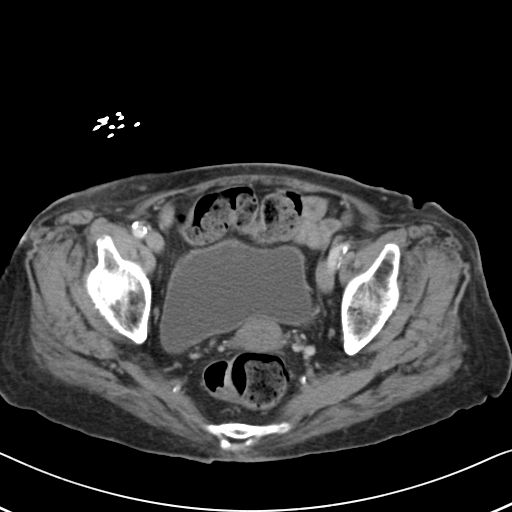
[im 30/84  soft-tissue]
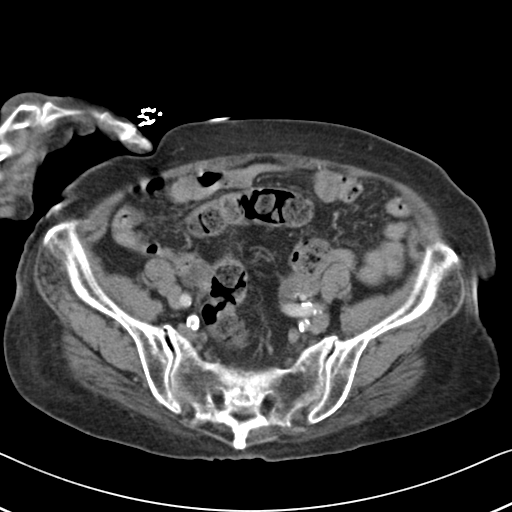
[im 34/84  soft-tissue]
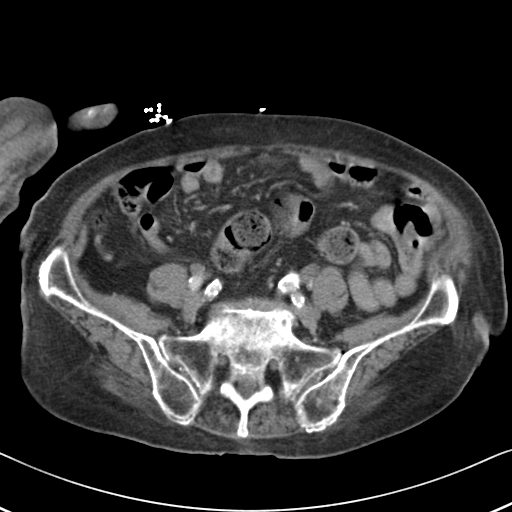
[im 42/84  soft-tissue]
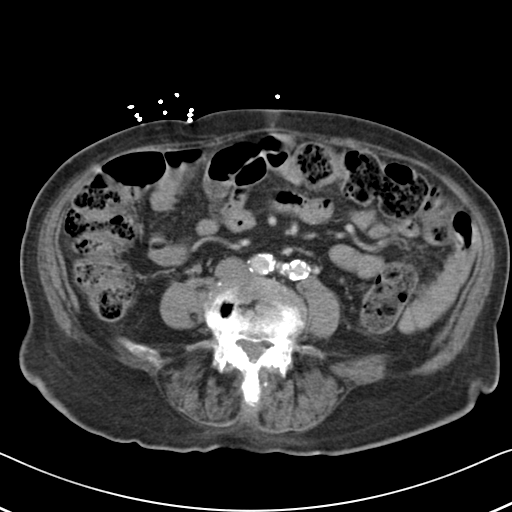
[im 50/84  soft-tissue]
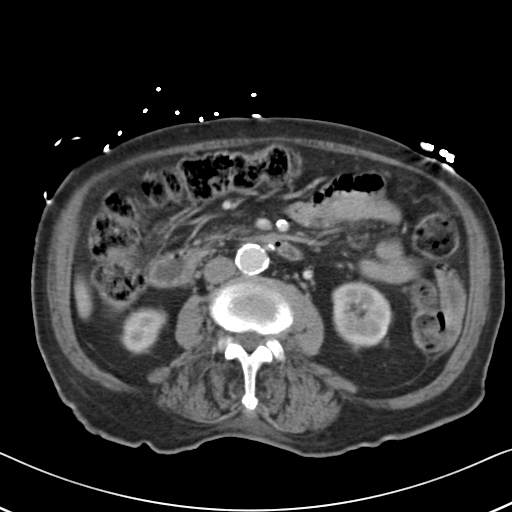
[im 54/84  soft-tissue]
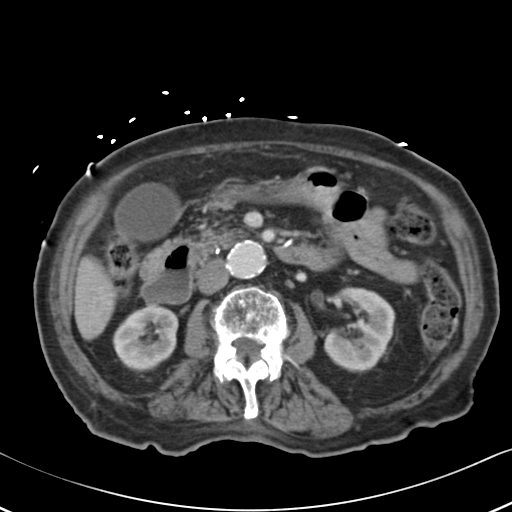
[im 63/84  soft-tissue]
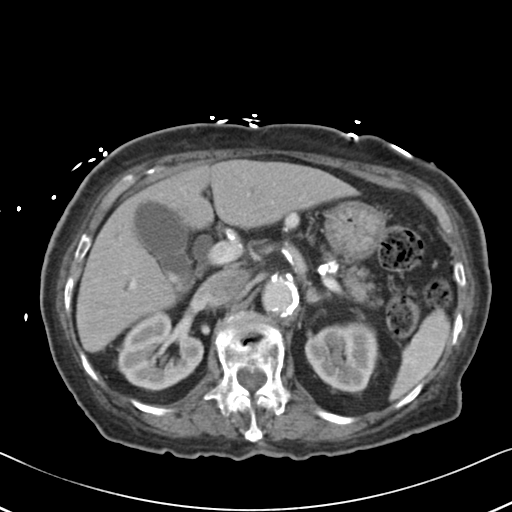
[im 63/84  bone]
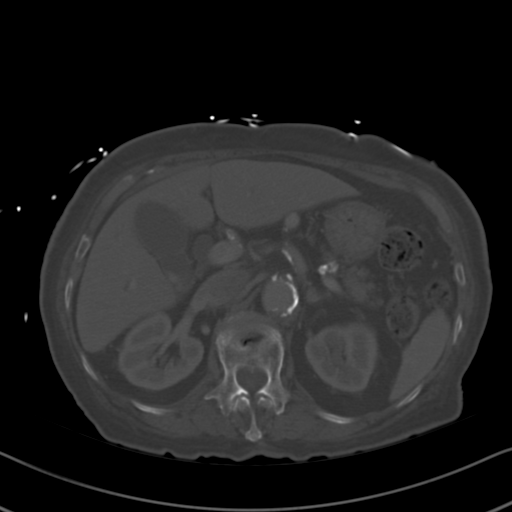
[im 67/84  lung]
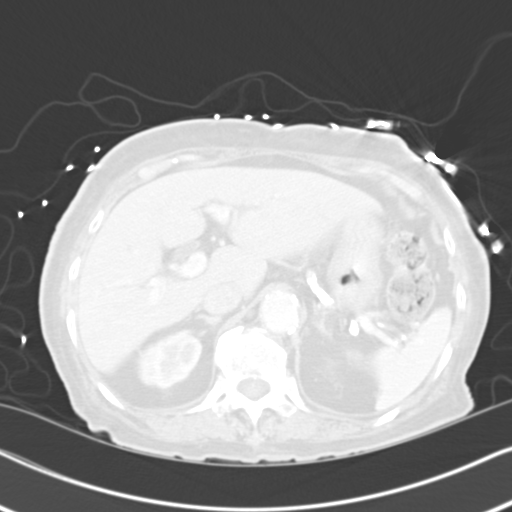
[im 71/84  soft-tissue]
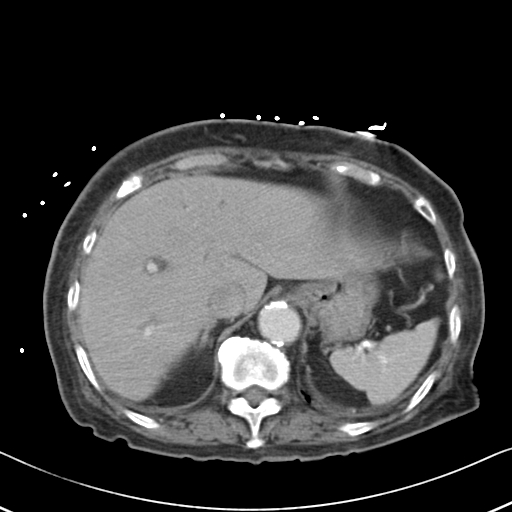
[im 71/84  lung]
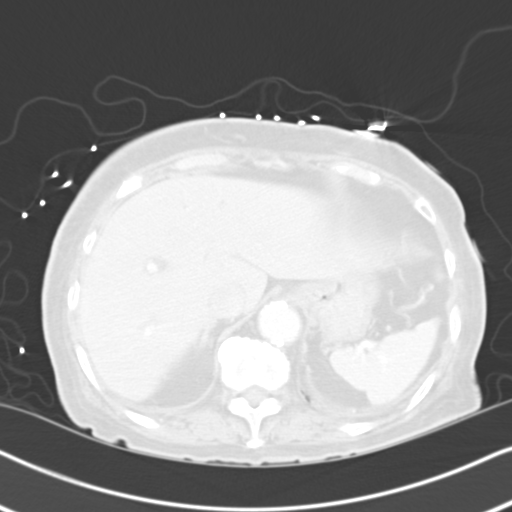
[im 75/84  lung]
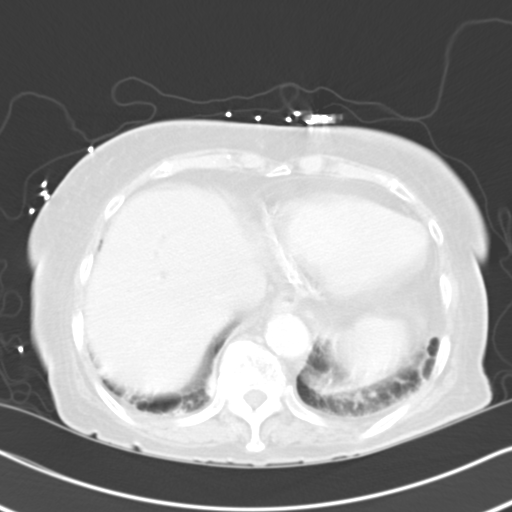
[im 79/84  soft-tissue]
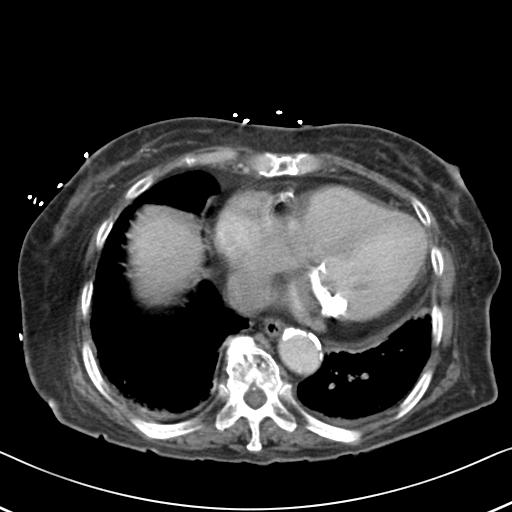
[im 79/84  lung]
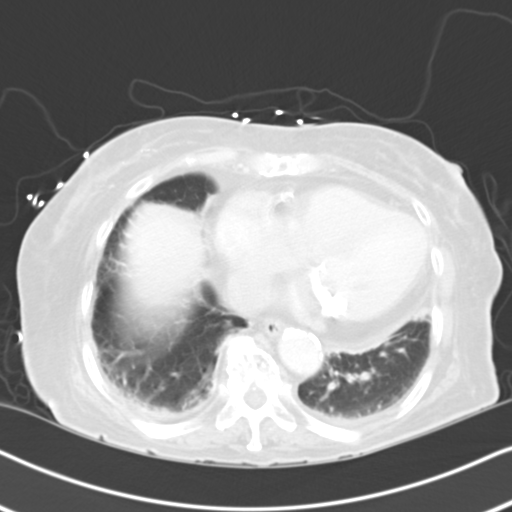

[13 of 32 positions shown; findings below may reference images not displayed]

FINDINGS: Lower chest: Mild scarring or subsegmental atelectasis in the
visualize lung bases bilaterally. Mild cardiomegaly. Extensive
calcifications of the mitral annulus. Thickening calcification of
the aortic valve. Atherosclerotic calcifications are noted in the
left anterior descending and right coronary arteries.

Hepatobiliary: No cystic or solid hepatic lesions. Mild intrahepatic
biliary ductal dilatation. In addition, common bile duct is dilated
up to 12 mm in the porta hepatis. No definite retained stones are
identified in the common bile duct on today's examination. There are
calcified gallstones in the dependent portion of the gallbladder
measuring up to 1 cm in diameter. Gallbladder is only mildly
distended, without gallbladder wall thickening or pericholecystic
fluid to suggest an acute cholecystitis at this time.

Pancreas: Fatty atrophy throughout the pancreas. No definite
pancreatic mass. No pancreatic ductal dilatation. No pancreatic or
peripancreatic fluid or inflammatory changes.

Spleen: Unremarkable.

Adrenals/Urinary Tract: No signs of significant acute traumatic
injury to either kidney. No definite calcifications are identified
within the collecting system of either kidney, along the course of
either ureter, or within the lumen of the urinary bladder. No
hydroureteronephrosis or perinephric stranding. Urinary bladder is
normal in appearance. Bilateral adrenal glands are normal in
appearance.

Stomach/Bowel: The appearance of the stomach is normal. No
pathologic dilatation of small bowel or colon. Normal appendix.

Vascular/Lymphatic: No signs of acute traumatic injury to the major
vasculature of the abdomen or pelvis. Extensive atherosclerosis
throughout the abdominal and pelvic vasculature, with some fusiform
ectasia of the infrarenal abdominal aorta which measures up to 2.4 x
2.6 cm. No evidence of frank aneurysm or dissection. No
lymphadenopathy noted in the abdomen or pelvis.

Reproductive: Uterus and ovaries are atrophic.

Other: No high attenuation fluid collection in the peritoneal cavity
or retroperitoneum to suggest significant posttraumatic hemorrhage.
No significant volume of ascites. No pneumoperitoneum.

Musculoskeletal: Chronic appearing compression fracture of L1 with
approximately 40% loss of anterior vertebral body height. Old healed
nondisplaced fractures of the anterolateral aspects of the right
fifth and sixth ribs. There are no aggressive appearing lytic or
blastic lesions noted in the visualized portions of the skeleton.
IMPRESSION: 1. No signs of significant acute traumatic injury in the abdomen or
pelvis.
2. No explanation for the patient's history of hematuria.
3. Cholelithiasis. No findings to suggest acute cholecystitis at
this time.
4. Mild intra and extrahepatic biliary ductal dilatation. Given the
patient's advanced age, this may be within normal limits, however,
clinical correlation for signs and symptoms of biliary tract
obstruction is suggested. No obstructing stone identified in the
common bile duct.
5. Extensive atherosclerosis, including multivessel coronary artery
disease as well as fusiform infrarenal abdominal aortic ectasia (2.6
x 2.4 cm in diameter).
6. Normal appendix.
7. Additional incidental findings, as above.
